# Patient Record
Sex: Female | Born: 1935 | Race: White | Hispanic: No | Smoking: Never smoker
Health system: Southern US, Community
[De-identification: ages and names within clinical notes are randomized; demographics above are authoritative.]

## PROBLEM LIST (undated history)

## (undated) DIAGNOSIS — I10 Essential (primary) hypertension: Secondary | ICD-10-CM

## (undated) DIAGNOSIS — R011 Cardiac murmur, unspecified: Secondary | ICD-10-CM

## (undated) DIAGNOSIS — M1990 Unspecified osteoarthritis, unspecified site: Secondary | ICD-10-CM

## (undated) DIAGNOSIS — D649 Anemia, unspecified: Secondary | ICD-10-CM

## (undated) DIAGNOSIS — I4891 Unspecified atrial fibrillation: Secondary | ICD-10-CM

## (undated) DIAGNOSIS — I714 Abdominal aortic aneurysm, without rupture, unspecified: Secondary | ICD-10-CM

## (undated) DIAGNOSIS — E785 Hyperlipidemia, unspecified: Secondary | ICD-10-CM

## (undated) DIAGNOSIS — C801 Malignant (primary) neoplasm, unspecified: Secondary | ICD-10-CM

## (undated) DIAGNOSIS — M2550 Pain in unspecified joint: Secondary | ICD-10-CM

## (undated) DIAGNOSIS — G4700 Insomnia, unspecified: Secondary | ICD-10-CM

## (undated) HISTORY — DX: Essential (primary) hypertension: I10

## (undated) HISTORY — DX: Unspecified atrial fibrillation: I48.91

## (undated) HISTORY — DX: Pain in unspecified joint: M25.50

## (undated) HISTORY — DX: Insomnia, unspecified: G47.00

## (undated) HISTORY — DX: Abdominal aortic aneurysm, without rupture: I71.4

## (undated) HISTORY — DX: Malignant (primary) neoplasm, unspecified: C80.1

## (undated) HISTORY — DX: Anemia, unspecified: D64.9

## (undated) HISTORY — DX: Abdominal aortic aneurysm, without rupture, unspecified: I71.40

## (undated) HISTORY — DX: Unspecified osteoarthritis, unspecified site: M19.90

## (undated) HISTORY — DX: Cardiac murmur, unspecified: R01.1

## (undated) HISTORY — PX: ABDOMINAL HYSTERECTOMY: SHX81

## (undated) HISTORY — DX: Hyperlipidemia, unspecified: E78.5

## (undated) HISTORY — PX: MASTECTOMY: SHX3

## (undated) HISTORY — PX: FRACTURE SURGERY: SHX138

---

## 2005-06-07 ENCOUNTER — Ambulatory Visit: Payer: Self-pay | Admitting: Oncology

## 2006-06-12 ENCOUNTER — Ambulatory Visit: Payer: Self-pay | Admitting: Oncology

## 2007-10-29 ENCOUNTER — Ambulatory Visit: Payer: Self-pay | Admitting: Vascular Surgery

## 2007-11-05 ENCOUNTER — Encounter: Admission: RE | Admit: 2007-11-05 | Discharge: 2007-11-05 | Payer: Self-pay | Admitting: Vascular Surgery

## 2008-05-19 ENCOUNTER — Ambulatory Visit: Payer: Self-pay | Admitting: Vascular Surgery

## 2008-11-17 ENCOUNTER — Ambulatory Visit: Payer: Self-pay | Admitting: Vascular Surgery

## 2009-05-18 ENCOUNTER — Ambulatory Visit: Payer: Self-pay | Admitting: Vascular Surgery

## 2009-12-07 ENCOUNTER — Ambulatory Visit: Payer: Self-pay | Admitting: Vascular Surgery

## 2010-05-31 ENCOUNTER — Ambulatory Visit: Payer: Self-pay | Admitting: Vascular Surgery

## 2010-12-07 ENCOUNTER — Ambulatory Visit: Payer: Self-pay | Admitting: Vascular Surgery

## 2011-01-04 ENCOUNTER — Encounter (INDEPENDENT_AMBULATORY_CARE_PROVIDER_SITE_OTHER): Payer: Medicare Other

## 2011-01-04 ENCOUNTER — Ambulatory Visit (INDEPENDENT_AMBULATORY_CARE_PROVIDER_SITE_OTHER): Payer: Medicare Other | Admitting: Vascular Surgery

## 2011-01-04 DIAGNOSIS — I714 Abdominal aortic aneurysm, without rupture, unspecified: Secondary | ICD-10-CM

## 2011-01-05 NOTE — Assessment & Plan Note (Signed)
OFFICE VISIT  Tara, [name] DOB:  13-Apr-1918                                       01/04/2011 EAVWU#:[id number]  The patient has a known small abdominal aortic aneurysm.  She presents today for evaluation of this.  She was last seen in August of 2009.  At that time her aortic diameter was 4.8 cm in diameter.  She continues to deny any abdominal or back pain.  CHRONIC MEDICAL PROBLEMS:  Include elevated cholesterol, hypertension. She does have a remote history of breast cancer.  All of these problems are currently controlled.  SOCIAL HISTORY:  She currently lives alone and performs all of her daily activities on her own.  She does have 2 children who live nearby that help out occasionally.  She does not smoke.  FAMILY HISTORY:  Not remarkable for abdominal aortic aneurysm or other peripheral vascular disease.  REVIEW OF SYSTEMS:  Full 12 point review of systems was performed with the patient today. CARDIAC:  She has an occasional irregular heartbeat.  She denies any chest pain. PULMONARY:  She has no shortness of breath with exertion or at rest. All other systems were negative.  ALLERGIES:  She has no known drug allergies.  MEDICATIONS:  She did not bring a list of her current medications today.  PHYSICAL EXAM:  Vital signs:  Blood pressure is 124/63 in the left arm, heart rate 76 and regular.  General:  She is alert and oriented x3, in no distress.  HEENT:  Unremarkable.  Neck:  Has 2+ carotid pulses without bruit.  Chest:  Clear to auscultation.  Cardiac:  Regular rate and rhythm with a 3/6 murmur.  Abdomen:  Soft, nontender, nondistended. No pulsatile masses easily found.  Extremities:  She has 2+ radial and 2+ femoral pulses bilaterally.  She has absent pedal pulses bilaterally. Musculoskeletal:  Shows no major obvious joint deformities except for her right wrist from a previous fall out of a tree as a teenager. Neurological:  Exam shows  symmetric upper extremity and lower extremity motor strength which is 5/5.  Skin:  Has no open ulcers or rashes.  I reviewed her aortic ultrasound today which shows a 4.89 cm abdominal aortic aneurysm.  This is essentially unchanged from 4.8 cm in August of 2011 and overall unchanged over the last 3 years.  Overall the patient is doing well.  Her aneurysm has really not show any growth over the last 3 years.  We will continue intermittent surveillance to make sure that if it grows over time we will be able to fix this in a timely fashion.  She will return for a repeat ultrasound in 6 months' time.  She will return sooner if she has any abdominal or back pain.    Tara [name]. Tara Lowrimore, MD Electronically Signed  CEF/MEDQ  D:  01/04/2011  T:  01/05/2011  Job:  [id number]  cc:   Tara [name], M.D.

## 2011-01-13 NOTE — Procedures (Unsigned)
DUPLEX ULTRASOUND OF ABDOMINAL AORTA  INDICATION:  Follow up known AAA.  HISTORY: Diabetes:  No Cardiac:  Murmur Hypertension:  Yes Smoking:  No Family History:  No Previous Surgery:  No  DUPLEX EXAM:         AP (cm)                   TRANSVERSE (cm) Proximal             2.57 cm                   2.54 cm Mid                  2.62 cm                   2.65 cm Distal               4.76 cm                   4.89 cm Right Iliac          not visualized            not visualized Left Iliac           not visualized            not visualized  PREVIOUS:  Date: 05/31/2010  AP:  4.8  TRANSVERSE:  4.8  IMPRESSION: 1. Aneurysmal dilatation of the distal abdominal aorta with no     significant change in maximum diameter when compared to the     previous exam. 2. Unable to visualize bilateral common iliac arteries due to     overlying bowel gas.  ___________________________________________ Janetta Hora. Darrick Penna, MD  EM/MEDQ  D:  01/05/2011  T:  01/05/2011  Job:  [id number]

## 2011-02-27 NOTE — Procedures (Signed)
DUPLEX ULTRASOUND OF ABDOMINAL AORTA   INDICATION:  Abdominal aortic aneurysm.   HISTORY:  Diabetes:  No.  Cardiac:  Murmur.  Hypertension:  No.  Smoking:  No.  Connective Tissue Disorder:  Family History:  No.  Previous Surgery:  No.   DUPLEX EXAM:         AP (cm)                   TRANSVERSE (cm)  Proximal             2.5 cm                    2.5 cm  Mid                  2.5 cm                    2.4 cm  Distal               4.5 cm                    4.7 cm  Right Iliac          1.1 cm                    1.2 cm  Left Iliac           1.5 cm                    1.5 cm   PREVIOUS:  Date:  AP:  TRANSVERSE:   IMPRESSION:  Aneurysm of the distal abdominal aorta noted.   ___________________________________________  Janetta Hora Fields, MD   CH/MEDQ  D:  05/19/2008  T:  05/19/2008  Job:  [id number]

## 2011-02-27 NOTE — Procedures (Signed)
DUPLEX ULTRASOUND OF ABDOMINAL AORTA   INDICATION:  Abdominal aortic aneurysm, patient states that she is  asymptomatic.   HISTORY:  Diabetes:  No.  Cardiac:  Murmur.  Hypertension:  Yes.  Smoking:  No.  Connective Tissue Disorder:  Family History:  No.  Previous Surgery:  No.   DUPLEX EXAM:         AP (cm)                   TRANSVERSE (cm)  Proximal             2.6 cm                    2.6 cm  Mid                  2.5 cm                    2.8 cm  Distal               4.8 cm                    4.8 cm  Right Iliac          Not visualized            Not visualized  Left Iliac           1.4 cm                    1.1 cm   PREVIOUS:  Date: 12/07/2009  AP:  4.63  TRANSVERSE:  4.73   IMPRESSION:  1. Aneurysmal dilatation of the distal abdominal aorta with no      significant change in maximum diameter when compared to the      previous examination.  2. Unable to adequately visualize the right common iliac artery due to      overlying bowel gas patterns.  3. Incidental finding:  A Doppler velocity of 298 cm/s is noted in the      celiac artery, which is suggestive of a >75% stenosis.   ___________________________________________  Janetta Hora Fields, MD   CH/MEDQ  D:  05/31/2010  T:  05/31/2010  Job:  [id number]

## 2011-02-27 NOTE — Assessment & Plan Note (Signed)
OFFICE VISIT   EARLE, BURSON  DOB:  1918-01-12                                       05/19/2008  CHART#:[id number]   The patient is an 77 year old female I have been following for a small  abdominal aortic aneurysm.  She was last seen in January of 2009.  Since  that time she has had no new problems.  She continues to deny any  abdominal or back pain.  She states that her blood pressure is well  controlled.   PHYSICAL EXAMINATION:  On physical exam today blood pressure is 124/68  in the left arm.  Pulse is 75 and regular.  HEENT:  Unremarkable.  Neck:  Has 2+ carotid pulses without bruit.  Chest:  Clear to auscultation.  Cardiac:  Is regular rate and rhythm.  Abdomen:  Is soft, nontender,  nondistended with vaguely palpable pulsatile mass.  Vascular:  She has  2+ femoral pulses bilaterally.  She has absent pedal pulses bilaterally.  Feet are pink, warm and adequately perfused.   MEDICATIONS:  1. Medication list was updated today.  She currently takes Lotrel 5/20      two a day.  2. Zocor 40 mg daily at bedtime.  3. Carvedilol 25 mg two a day.  4. Trazodone 100 mg daily at bedtime.  5. Fosamax 70 mg once a week.  6. Omega-3 fish oil 3 a day.  7. Calcium 3 a day.  8. Aspirin 325 mg once a day.  9. Tylenol Arthritis 3 times a day.   She had an aortic ultrasound performed today which shows that the  aneurysm is 4.7 cm in diameter.  Overall the patient is unchanged.  I  believe we should have continued observation with aortic ultrasound  every 6 months.  If this reaches 5 to 5.5 cm we would consider repair at  that time.  The patient will return for followup in 6 months' time.   Janetta Hora. Fields, MD  Electronically Signed   CEF/MEDQ  D:  05/20/2008  T:  05/20/2008  Job:  [id number]   cc:   Raynelle Jan, M.D.

## 2011-02-27 NOTE — Assessment & Plan Note (Signed)
OFFICE VISIT   DESHIA, VANDERHOOF  DOB:  [birth year]                                       10/29/2007  CHART#:[id number]   Patient is an 77 year old female referred for evaluation of abdominal  aortic aneurysm.  She recently had a CT scan for abdominal pain and some  mild rectal bleeding.  On the CT scan, she was noted to have a 4.8 cm  abdominal aortic aneurysm.  Her pain has since resolved.  This was last  week.  She states that the hematochezia has also resolved.  She is  fairly active and lives alone and does her own activities of daily  living.  Denies a family history of aneurysm.  Atherosclerotic risk  factors are primarily remarkable for elevated cholesterol.  She denies a  history of coronary artery disease, hypertension, diabetes, or smoking.   Past medical history is otherwise remarkable for breast cancer seven  years ago.  She had removal of this cancer from her left breast at that  time.   Past surgical history is also remarkable for appendectomy, rectocele and  cystocele repair.  She also had a fairly traumatic injury to her right  wrist, which left her with some deformity from a severe fracture as a  child with osteomyelitis.   MEDICATIONS:  1. Aspirin once daily.  2. Calcium once daily.  3. Fish oil once daily.  4. Garlic once daily.  5. Toprol once daily.  6. Simvastatin once daily.  7. Trazodone at night.  8. Alendronate once daily.  9. She is also currently on Cipro and Flagyl.  This was apparently      started for presumed diverticulitis.   PHYSICAL EXAMINATION:  Blood pressure is 152/76, heart rate 85 and  regular.  HEENT:  Unremarkable.  She has 2+ carotid pulses without  bruits.  Chest:  Clear to auscultation.  Cardiac:  Regular rate and  rhythm without murmur.  Abdomen is soft and nontender with an easily  palpable pulsatile mass in the epigastrium.  Lower extremities:  She has  2+ femoral pulses with absent popliteal and pedal  pulses bilaterally.  Upper extremities:  She has 2+ brachial and radial pulse on the left  side.  She has absent brachial and radial pulse on the right side.  Right wrist is deformed.  She has flexion contractures of several of her  digits in the right hand.   I reviewed her CT scan of the abdomen and pelvis, dated October 27, 2007.  She has a 4.8 cm infrarenal abdominal aortic aneurysm by report;  however, by my measurement, this approaches 5 cm.  CT scan was done in 5  mm sections.  The neck of the aneurysm is approximately 26 mm.  Common  femoral arteries are 9 mm in diameter.  There is no evidence of rupture.  There was some inflammation in the left pelvic wall suspicious for  diverticulitis.  There is no free air.  CT scan was otherwise fairly  unremarkable.   I do not believe that patient's aneurysm is currently symptomatic;  however, it is of a size where repair should be considered.  In light of  the patient's advanced and overall fairly frail state on exam, I do not  believe she would be a candidate for open repair at this time; however,  I have ordered  a CT angiogram to see if she might be a candidate for  stent graft repair.  I will re-evaluate her next week after her CT scan.  I discussed the findings today with her son and the patient as well.   Janetta Hora. Fields, MD  Electronically Signed   CEF/MEDQ  D:  10/30/2007  T:  10/30/2007  Job:  [id number]   cc:   Raynelle Jan, M.D.

## 2011-02-27 NOTE — Procedures (Signed)
DUPLEX ULTRASOUND OF ABDOMINAL AORTA   INDICATION:  Abdominal aortic aneurysm.   HISTORY:  Diabetes:  No.  Cardiac:  Murmur.  Hypertension:  Yes.  Smoking:  No.  Connective Tissue Disorder:  Family History:  No.  Previous Surgery:  No.   DUPLEX EXAM:         AP (cm)                   TRANSVERSE (cm)  Proximal             2.4 cm                    2.3 cm  Mid                  2.5 cm                    2.3 cm  Distal               4.6 cm                    4.7 cm  Right Iliac          1.3 cm                    1.3 cm  Left Iliac           1.3 cm                    1.4 cm   PREVIOUS:  Date: 11/17/08  AP:  4.5  TRANSVERSE:  4.7   IMPRESSION:  Aneurysm of the mid-to-distal abdominal aorta with no  significant change in maximum diameter when compared to the previous  examination.   ___________________________________________  Janetta Hora Fields, MD   CH/MEDQ  D:  05/18/2009  T:  05/18/2009  Job:  [id number]

## 2011-02-27 NOTE — Procedures (Signed)
DUPLEX ULTRASOUND OF ABDOMINAL AORTA   INDICATION:  Followup abdominal aortic aneurysm.   HISTORY:  Diabetes:  No.  Cardiac:  Murmur.  Hypertension:  Yes.  Smoking:  No.  Connective Tissue Disorder:  Family History:  No.  Previous Surgery:  No.   DUPLEX EXAM:         AP (cm)                   TRANSVERSE (cm)  Proximal             2.29 cm                   2.12 cm  Mid                  2.74 cm                   2.87 cm  Distal               4.54 cm                   4.79 cm  Right Iliac          1.26 cm                   cm  Left Iliac           1.28 cm                   cm   PREVIOUS:  Date:  05/19/2008  AP:  4.5  TRANSVERSE:  4.7   IMPRESSION:  1. Stable abdominal aortic aneurysm with largest measurement of 4.54      cm x 4.79 cm.  2. Common iliac arteries are technically difficult to visualize well      due to bowel gas.    ___________________________________________  Janetta Hora. Fields, MD   AS/MEDQ  D:  11/17/2008  T:  11/17/2008  Job:  [id number]

## 2011-02-27 NOTE — Procedures (Signed)
DUPLEX ULTRASOUND OF ABDOMINAL AORTA   INDICATION:  Follow up abdominal aortic aneurysm.   HISTORY:  Diabetes:  No.  Cardiac:  Murmur.  Hypertension:  Yes.  Smoking:  No.  Connective Tissue Disorder:  Family History:  No.  Previous Surgery:  No.   DUPLEX EXAM:         AP (cm)                   TRANSVERSE (cm)  Proximal             1.94 cm                   1.96 cm  Mid                  2.14 cm                   2.42 cm  Distal               4.63 cm                   4.73 cm  Right Iliac          1.08 cm                   1.19 cm  Left Iliac           1.01 cm                   1.22 cm   PREVIOUS:  Date:  05/18/09  AP:  4.6  TRANSVERSE:  4.7   IMPRESSION:  Stable abdominal aortic aneurysm with largest measurement  of 4.63 cm X 4.73 cm.   ___________________________________________  Janetta Hora. Fields, MD   AS/MEDQ  D:  12/07/2009  T:  12/07/2009  Job:  [id number]

## 2011-08-02 ENCOUNTER — Encounter: Payer: Self-pay | Admitting: Vascular Surgery

## 2011-08-02 ENCOUNTER — Ambulatory Visit: Payer: Medicare Other | Admitting: Vascular Surgery

## 2011-08-09 ENCOUNTER — Encounter: Payer: Self-pay | Admitting: Vascular Surgery

## 2011-08-09 ENCOUNTER — Ambulatory Visit (INDEPENDENT_AMBULATORY_CARE_PROVIDER_SITE_OTHER): Payer: Medicare Other | Admitting: Vascular Surgery

## 2011-08-09 VITALS — BP 162/75 | HR 67 | Temp 97.5°F | Ht 65.0 in | Wt 122.0 lb

## 2011-08-09 DIAGNOSIS — I714 Abdominal aortic aneurysm, without rupture: Secondary | ICD-10-CM

## 2011-08-09 NOTE — Progress Notes (Signed)
[facility] [place] HISTORY AND PHYSICAL   History of Present Illness:  Patient is a 77 y.o. year old female who presents for follow-up evaluation of AAA.  The patient denies new abdominal or back pain.  The patient's atherosclerotic risk factors remain elevated cholesterol and hypertension.  These are all currently stable and followed by their primary care physician.  The patients AAA was 4.7 cm on intial evaluation in 2009.  She had a CT angio at [facility] 07/27/2011 which I reviewed today.  He aneurysm is 4.9 cm in diameter. It is infrarenal. She does have a 3.5 cm ectatic thoracic aorta. The neck of her infrarenal aorta is fairly short and angulated. She also has some evidence of calcified atherosclerotic plaque in her right common femoral artery. There was no evidence of rupture.  Past Medical History  Diagnosis Date  . AAA (abdominal aortic aneurysm)   . Heart murmur   . Hyperlipidemia   . Cancer     breast  . Arthritis   . Joint pain   . Hypertension   . Osteoporosis   . Insomnia   . Allergic rhinitis     Past Surgical History  Procedure Date  . Mastectomy     Left breast  . Abdominal hysterectomy     Review of Systems:  Neurologic: denies symptoms of TIA, amaurosis, or stroke Cardiac:denies shortness of breath or chest pain Pulmonary: denies cough or wheeze  Social History History  Substance Use Topics  . Smoking status: Never Smoker   . Smokeless tobacco: Not on file  . Alcohol Use: No  Remains active and lives independently, does not drive  Allergies  No Known Allergies   Current Outpatient Prescriptions  Medication Sig Dispense Refill  . amLODipine (NORVASC) 5 MG tablet Take 5 mg by mouth daily.        Marland Kitchen aspirin EC 81 MG tablet Take 81 mg by mouth daily.        Marland Kitchen CALCIUM PO Take by mouth.        . fish oil-omega-3 fatty acids 1000 MG capsule Take 2 g by mouth daily.        Marland Kitchen GARLIC PO Take by mouth.        . METOPROLOL TARTRATE PO  Take 25 mg by mouth daily.       Marland Kitchen SIMVASTATIN PO Take by mouth.          Physical Examination  Filed Vitals:   08/09/11 1429  BP: 162/75  Pulse: 67  Temp: 97.5 F (36.4 C)  TempSrc: Oral  Height: 5\' 5"  (1.651 m)  Weight: 122 lb (55.339 kg)  SpO2: 97%    Body mass index is 20.30 kg/(m^2).  General:  Alert and oriented, no acute distress HEENT: Normal Neck: No bruit or JVD Pulmonary: Clear to auscultation bilaterally Cardiac: Regular Rate and Rhythm 3/6 systolic murmur Abdomen: Soft, non-tender, non-distended, normal bowel sounds, palpable pulsatile mass in epigastrium    ASSESSMENT: Asymptomatic 4.9 cm abdominal aortic aneurysm. If the aneurysm grows to 5-5-1/2 cm we would consider repair at that point. It is certainly nearing end-stage now. She may be a stent graft candidate however due to the short angulated neck she may require open repair if we come to that.   PLAN: She will followup in 6 months time and an aortic ultrasound will also be repeated in 6 months time. If the aneurysm is greater than 5 cm on her next exam we will have discussions about  open versus endovascular repair. Symptoms of ruptured aneurysm as well as all these options were discussed with the patient and her son today.  Fabienne Bruns, MD [facility] [place] Office: [phone] Pager: [phone]

## 2011-08-22 ENCOUNTER — Encounter: Payer: Self-pay | Admitting: Vascular Surgery

## 2011-09-28 ENCOUNTER — Encounter: Payer: Self-pay | Admitting: Vascular Surgery

## 2011-12-12 ENCOUNTER — Other Ambulatory Visit: Payer: Self-pay | Admitting: *Deleted

## 2011-12-12 DIAGNOSIS — I714 Abdominal aortic aneurysm, without rupture: Secondary | ICD-10-CM

## 2012-02-13 ENCOUNTER — Encounter: Payer: Self-pay | Admitting: Vascular Surgery

## 2012-02-14 ENCOUNTER — Ambulatory Visit (INDEPENDENT_AMBULATORY_CARE_PROVIDER_SITE_OTHER): Payer: Medicare Other | Admitting: Neurosurgery

## 2012-02-14 ENCOUNTER — Encounter: Payer: Self-pay | Admitting: Neurosurgery

## 2012-02-14 ENCOUNTER — Encounter (INDEPENDENT_AMBULATORY_CARE_PROVIDER_SITE_OTHER): Payer: Medicare Other | Admitting: *Deleted

## 2012-02-14 VITALS — BP 149/70 | HR 68 | Resp 16 | Ht 65.0 in | Wt 129.3 lb

## 2012-02-14 DIAGNOSIS — I714 Abdominal aortic aneurysm, without rupture, unspecified: Secondary | ICD-10-CM | POA: Insufficient documentation

## 2012-02-14 NOTE — Progress Notes (Signed)
[facility] [place] HISTORY AND PHYSICAL   CC: Six-month AAA duplex for known aneurysm Referring Physician: Fields  History of Present Illness: 78 year old female patient of Dr. Darrick Penna followed for serial duplex for known AAA. The patient seen with a female family member who reports no new medical problems with the patient, she does not have any complaints of back or abdominal pain.  Past Medical History  Diagnosis Date  . AAA (abdominal aortic aneurysm)   . Heart murmur   . Hyperlipidemia   . Cancer     breast  . Arthritis   . Joint pain   . Hypertension   . Osteoporosis   . Insomnia   . Allergic rhinitis     ROS: [x]  Positive   [ ]  Denies    General: [ ]  Weight loss, [ ]  Fever, [ ]  chills Neurologic: [ ]  Dizziness, [ ]  Blackouts, [ ]  Seizure [ ]  Stroke, [ ]  "Mini stroke", [ ]  Slurred speech, [ ]  Temporary blindness; [ ]  weakness in arms or legs, [ ]  Hoarseness, the patient does report some numbness in her lower extremities. Cardiac: [ ]  Chest pain/pressure, [ ]  Shortness of breath at rest [ ]  Shortness of breath with exertion, [x ] Atrial fibrillation or irregular heartbeat Vascular: [ ]  Pain in legs with walking, [ ]  Pain in legs at rest, [ ]  Pain in legs at night,  [ ]  Non-healing ulcer, [ ]  Blood clot in vein/DVT,   Pulmonary: [ ]  Home oxygen, [ ]  Productive cough, [ ]  Coughing up blood, [ ]  Asthma,  [ ]  Wheezing Musculoskeletal:  [ ]  Arthritis, [ ]  Low back pain, [ ]  Joint pain Hematologic: [ ]  Easy Bruising, [ ]  Anemia; [ ]  Hepatitis Gastrointestinal: [ ]  Blood in stool, [ ]  Gastroesophageal Reflux/heartburn, [ ]  Trouble swallowing Urinary: [ ]  chronic Kidney disease, [ ]  on HD - [ ]  MWF or [ ]  TTHS, [ ]  Burning with urination, [ ]  Difficulty urinating Skin: [ ]  Rashes, [ ]  Wounds Psychological: [ ]  Anxiety, [ ]  Depression   Social History History  Substance Use Topics  . Smoking status: Never Smoker   . Smokeless tobacco: Not on file  .  Alcohol Use: No    Family History History reviewed. No pertinent family history.  No Known Allergies  Current Outpatient Prescriptions  Medication Sig Dispense Refill  . amLODipine (NORVASC) 5 MG tablet Take 5 mg by mouth daily.        Marland Kitchen amLODipine-benazepril (LOTREL) 5-20 MG per capsule Take 5-20 mg by mouth Daily.      Marland Kitchen aspirin EC 81 MG tablet Take 81 mg by mouth daily.        Marland Kitchen CALCIUM PO Take by mouth.        . CRESTOR 40 MG tablet Take 40 mg by mouth Daily.      . fish oil-omega-3 fatty acids 1000 MG capsule Take 2 g by mouth daily.        Marland Kitchen GARLIC PO Take by mouth.        . METOPROLOL TARTRATE PO Take 25 mg by mouth daily.       Marland Kitchen SIMVASTATIN PO Take by mouth.        . traZODone (DESYREL) 100 MG tablet Take 100 mg by mouth Ad lib.        Physical Examination  Filed Vitals:   02/14/12 1017  BP: 149/70  Pulse: 68  Resp: 16    Body  mass index is 21.52 kg/(m^2).  General:  WDWN in NAD Gait: Normal HEENT: WNL Eyes: Pupils equal Pulmonary: normal non-labored breathing , without Rales, rhonchi,  wheezing Cardiac: RRR, without  Murmurs, rubs or gallops; No carotid bruits Abdomen: soft, NT, no masses Skin: no rashes, ulcers noted Vascular Exam/Pulses: Patient has 2+ radial pulses, 2+ femoral pulses, I cannot palpate an abdominal pulsatile mass or lower extremity pulses  Extremities without ischemic changes, no Gangrene , no cellulitis; no open wounds;  Musculoskeletal: no muscle wasting or atrophy  Neurologic: A&O X 3; Appropriate Affect ; SENSATION: normal; MOTOR FUNCTION:  moving all extremities equally. Speech is fluent/normal  Non-Invasive Vascular Imaging: AAA duplex today shows a measurement of 4.9 previous was 4.89.  ASSESSMENT/PLAN: Patient with a known history of AAA that stable per duplex. She will followup in 6 months for repeat AAA duplex, her questions were encouraged and answered she is in agreement with this plan.  Lauree Chandler ANP  Clinic M.D.:  Fields

## 2012-02-14 NOTE — Progress Notes (Signed)
Addended by: Sharee Pimple on: 02/14/2012 02:35 PM   Modules accepted: Orders

## 2012-02-22 NOTE — Procedures (Unsigned)
DUPLEX ULTRASOUND OF ABDOMINAL AORTA  INDICATION:  Follow up AAA.  HISTORY: Diabetes:  No. Cardiac:  No. Hypertension:  Yes. Smoking:  No. Connective Tissue Disorder: Family History:  No. Previous Surgery:  No.  DUPLEX EXAM:         AP (cm)                   TRANSVERSE (cm) Proximal             2.26 cm                   2.22 cm Mid                  4.9 cm                    4.9 cm Distal               NV                        NV Right Iliac          1.1 cm Left Iliac           0.92 cm  PREVIOUS:  Date: 01/04/2011  AP:  4.76  TRANSVERSE:  4.89  IMPRESSION: 1. Markedly tortuous aorta with maximum diameter of 4.9 cm on today's     exam.  There is intramural thrombus observed. 2. Iliac arteries are difficult to visualize due to bowel gas and     tortuosity of the aorta.  ___________________________________________ Janetta Hora. Fields, MD  LT/MEDQ  D:  02/14/2012  T:  02/14/2012  Job:  [id number]

## 2012-08-20 ENCOUNTER — Encounter: Payer: Self-pay | Admitting: Neurosurgery

## 2012-08-21 ENCOUNTER — Encounter: Payer: Self-pay | Admitting: Neurosurgery

## 2012-08-21 ENCOUNTER — Encounter (INDEPENDENT_AMBULATORY_CARE_PROVIDER_SITE_OTHER): Payer: Medicare Other | Admitting: *Deleted

## 2012-08-21 ENCOUNTER — Ambulatory Visit (INDEPENDENT_AMBULATORY_CARE_PROVIDER_SITE_OTHER): Payer: Medicare Other | Admitting: Neurosurgery

## 2012-08-21 VITALS — BP 140/68 | HR 61 | Resp 16 | Ht 65.0 in | Wt 126.0 lb

## 2012-08-21 DIAGNOSIS — I714 Abdominal aortic aneurysm, without rupture, unspecified: Secondary | ICD-10-CM

## 2012-08-21 NOTE — Progress Notes (Signed)
[facility] [place] [facility] Office Note  CC: AAA surveillance Referring Physician: Fields  History of Present Illness: 78 year old female patient of Dr. Darrick Penna followed for known AAA. The patient denies any unusual abdominal or back pain, new medical diagnoses or recent surgery.  Past Medical History  Diagnosis Date  . AAA (abdominal aortic aneurysm)   . Heart murmur   . Hyperlipidemia   . Cancer     breast  . Arthritis   . Joint pain   . Hypertension   . Osteoporosis   . Insomnia   . Allergic rhinitis     ROS: [x]  Positive   [ ]  Denies    General: [ ]  Weight loss, [ ]  Fever, [ ]  chills Neurologic: [ ]  Dizziness, [ ]  Blackouts, [ ]  Seizure [ ]  Stroke, [ ]  "Mini stroke", [ ]  Slurred speech, [ ]  Temporary blindness; [ ]  weakness in arms or legs, [ ]  Hoarseness Cardiac: [ ]  Chest pain/pressure, [ ]  Shortness of breath at rest [ ]  Shortness of breath with exertion, [ ]  Atrial fibrillation or irregular heartbeat Vascular: [ ]  Pain in legs with walking, [ ]  Pain in legs at rest, [ ]  Pain in legs at night,  [ ]  Non-healing ulcer, [ ]  Blood clot in vein/DVT,   Pulmonary: [ ]  Home oxygen, [ ]  Productive cough, [ ]  Coughing up blood, [ ]  Asthma,  [ ]  Wheezing Musculoskeletal:  [ ]  Arthritis, [ ]  Low back pain, [ ]  Joint pain Hematologic: [ ]  Easy Bruising, [ ]  Anemia; [ ]  Hepatitis Gastrointestinal: [ ]  Blood in stool, [ ]  Gastroesophageal Reflux/heartburn, [ ]  Trouble swallowing Urinary: [ ]  chronic Kidney disease, [ ]  on HD - [ ]  MWF or [ ]  TTHS, [ ]  Burning with urination, [ ]  Difficulty urinating Skin: [ ]  Rashes, [ ]  Wounds Psychological: [ ]  Anxiety, [ ]  Depression   Social History History  Substance Use Topics  . Smoking status: Never Smoker   . Smokeless tobacco: Not on file  . Alcohol Use: No    Family History Family History  Problem Relation Age of Onset  . Family history unknown: Yes    No Known Allergies  Current Outpatient  Prescriptions  Medication Sig Dispense Refill  . amLODipine (NORVASC) 5 MG tablet Take 5 mg by mouth daily.        Marland Kitchen amLODipine-benazepril (LOTREL) 5-20 MG per capsule Take 5-20 mg by mouth Daily.      Marland Kitchen aspirin EC 81 MG tablet Take 81 mg by mouth daily.        Marland Kitchen CALCIUM PO Take by mouth.        . CRESTOR 40 MG tablet Take 40 mg by mouth Daily.      . Ezetimibe (ZETIA PO) Take by mouth daily.      . fish oil-omega-3 fatty acids 1000 MG capsule Take 2 g by mouth daily.        Marland Kitchen GARLIC PO Take by mouth.        . METOPROLOL TARTRATE PO Take 25 mg by mouth daily.       Marland Kitchen SIMVASTATIN PO Take by mouth.        . traZODone (DESYREL) 100 MG tablet Take 100 mg by mouth Ad lib.        Physical Examination  Filed Vitals:   08/21/12 1028  BP: 140/68  Pulse: 61  Resp: 16    Body mass index is 20.97 kg/(m^2).  General:  WDWN in NAD Gait: Normal HEENT: WNL Eyes: Pupils equal Pulmonary: normal non-labored breathing , without Rales, rhonchi,  wheezing Cardiac: RRR, without  Murmurs, rubs or gallops; No carotid bruits Abdomen: soft, NT, no masses Skin: no rashes, ulcers noted Vascular Exam/Pulses: Palpable lower extremity pulses bilaterally, there is a small abdominal pulsatile mass palpated  Extremities without ischemic changes, no Gangrene , no cellulitis; no open wounds;  Musculoskeletal: no muscle wasting or atrophy  Neurologic: A&O X 3; Appropriate Affect ; SENSATION: normal; MOTOR FUNCTION:  moving all extremities equally. Speech is fluent/normal  Non-Invasive Vascular Imaging: AAA duplex shows 4.9 measurement which is unchanged from previous exam in May 2013, this was reviewed with Dr. Darrick Penna  ASSESSMENT/PLAN: The patient will followup in 6 months with repeat AAA duplex, her questions were encouraged and answered, she is in agreement with this plan. Next  Lauree Chandler ANP  Clinic M.D.: Fields

## 2012-08-21 NOTE — Addendum Note (Signed)
Addended by: Sharee Pimple on: 08/21/2012 04:12 PM   Modules accepted: Orders

## 2013-02-19 ENCOUNTER — Encounter (INDEPENDENT_AMBULATORY_CARE_PROVIDER_SITE_OTHER): Payer: Medicare Other | Admitting: *Deleted

## 2013-02-19 ENCOUNTER — Ambulatory Visit: Payer: Medicare Other | Admitting: Neurosurgery

## 2013-02-19 DIAGNOSIS — I714 Abdominal aortic aneurysm, without rupture: Secondary | ICD-10-CM

## 2014-04-04 ENCOUNTER — Emergency Department (HOSPITAL_COMMUNITY): Payer: Medicare Other

## 2014-04-04 ENCOUNTER — Inpatient Hospital Stay (HOSPITAL_COMMUNITY)
Admission: EM | Admit: 2014-04-04 | Discharge: 2014-04-08 | DRG: 689 | Disposition: A | Discharge status: Skilled Nursing Facility | Payer: Medicare Other | Attending: Internal Medicine | Admitting: Internal Medicine

## 2014-04-04 ENCOUNTER — Encounter (HOSPITAL_COMMUNITY): Payer: Self-pay | Admitting: Emergency Medicine

## 2014-04-04 DIAGNOSIS — Z79899 Other long term (current) drug therapy: Secondary | ICD-10-CM

## 2014-04-04 DIAGNOSIS — G4700 Insomnia, unspecified: Secondary | ICD-10-CM | POA: Diagnosis present

## 2014-04-04 DIAGNOSIS — J189 Pneumonia, unspecified organism: Secondary | ICD-10-CM

## 2014-04-04 DIAGNOSIS — G9340 Encephalopathy, unspecified: Secondary | ICD-10-CM | POA: Diagnosis present

## 2014-04-04 DIAGNOSIS — Z9010 Acquired absence of unspecified breast and nipple: Secondary | ICD-10-CM

## 2014-04-04 DIAGNOSIS — N390 Urinary tract infection, site not specified: Principal | ICD-10-CM | POA: Diagnosis present

## 2014-04-04 DIAGNOSIS — M810 Age-related osteoporosis without current pathological fracture: Secondary | ICD-10-CM | POA: Diagnosis present

## 2014-04-04 DIAGNOSIS — R319 Hematuria, unspecified: Secondary | ICD-10-CM

## 2014-04-04 DIAGNOSIS — E440 Moderate protein-calorie malnutrition: Secondary | ICD-10-CM | POA: Diagnosis present

## 2014-04-04 DIAGNOSIS — I714 Abdominal aortic aneurysm, without rupture, unspecified: Secondary | ICD-10-CM

## 2014-04-04 DIAGNOSIS — F03918 Unspecified dementia, unspecified severity, with other behavioral disturbance: Secondary | ICD-10-CM

## 2014-04-04 DIAGNOSIS — E876 Hypokalemia: Secondary | ICD-10-CM

## 2014-04-04 DIAGNOSIS — R9431 Abnormal electrocardiogram [ECG] [EKG]: Secondary | ICD-10-CM

## 2014-04-04 DIAGNOSIS — R7989 Other specified abnormal findings of blood chemistry: Secondary | ICD-10-CM | POA: Diagnosis present

## 2014-04-04 DIAGNOSIS — D696 Thrombocytopenia, unspecified: Secondary | ICD-10-CM | POA: Diagnosis present

## 2014-04-04 DIAGNOSIS — Z7982 Long term (current) use of aspirin: Secondary | ICD-10-CM

## 2014-04-04 DIAGNOSIS — Z853 Personal history of malignant neoplasm of breast: Secondary | ICD-10-CM

## 2014-04-04 DIAGNOSIS — R410 Disorientation, unspecified: Secondary | ICD-10-CM | POA: Insufficient documentation

## 2014-04-04 DIAGNOSIS — I10 Essential (primary) hypertension: Secondary | ICD-10-CM

## 2014-04-04 DIAGNOSIS — E785 Hyperlipidemia, unspecified: Secondary | ICD-10-CM

## 2014-04-04 DIAGNOSIS — R627 Adult failure to thrive: Secondary | ICD-10-CM | POA: Diagnosis present

## 2014-04-04 DIAGNOSIS — R6889 Other general symptoms and signs: Secondary | ICD-10-CM

## 2014-04-04 DIAGNOSIS — F0391 Unspecified dementia with behavioral disturbance: Secondary | ICD-10-CM | POA: Diagnosis present

## 2014-04-04 LAB — COMPREHENSIVE METABOLIC PANEL
ALBUMIN: 3.7 g/dL (ref 3.5–5.2)
ALT: 14 U/L (ref 0–35)
AST: 23 U/L (ref 0–37)
Alkaline Phosphatase: 46 U/L (ref 39–117)
BUN: 23 mg/dL (ref 6–23)
CALCIUM: 10.4 mg/dL (ref 8.4–10.5)
CO2: 25 mEq/L (ref 19–32)
CREATININE: 1.01 mg/dL (ref 0.50–1.10)
Chloride: 106 mEq/L (ref 96–112)
GFR calc Af Amer: 54 mL/min — ABNORMAL LOW (ref >90)
GFR, EST NON AFRICAN AMERICAN: 46 mL/min — AB (ref >90)
Glucose, Bld: 115 mg/dL — ABNORMAL HIGH (ref 70–99)
Potassium: 3.5 mEq/L — ABNORMAL LOW (ref 3.7–5.3)
SODIUM: 145 meq/L (ref 137–147)
TOTAL PROTEIN: 7.4 g/dL (ref 6.0–8.3)
Total Bilirubin: 0.9 mg/dL (ref 0.3–1.2)

## 2014-04-04 LAB — CBC
HCT: 44 % (ref 36.0–46.0)
Hemoglobin: 14.5 g/dL (ref 12.0–15.0)
MCH: 30.1 pg (ref 26.0–34.0)
MCHC: 33 g/dL (ref 30.0–36.0)
MCV: 91.5 fL (ref 78.0–100.0)
PLATELETS: 126 10*3/uL — AB (ref 150–400)
RBC: 4.81 MIL/uL (ref 3.87–5.11)
RDW: 14.8 % (ref 11.5–15.5)
WBC: 8.1 10*3/uL (ref 4.0–10.5)

## 2014-04-04 LAB — POC OCCULT BLOOD, ED: FECAL OCCULT BLD: NEGATIVE

## 2014-04-04 LAB — RAPID URINE DRUG SCREEN, HOSP PERFORMED
AMPHETAMINES: NOT DETECTED
BENZODIAZEPINES: NOT DETECTED
Barbiturates: NOT DETECTED
Cocaine: NOT DETECTED
OPIATES: NOT DETECTED
Tetrahydrocannabinol: NOT DETECTED

## 2014-04-04 LAB — URINALYSIS, ROUTINE W REFLEX MICROSCOPIC
Bilirubin Urine: NEGATIVE
GLUCOSE, UA: NEGATIVE mg/dL
Ketones, ur: NEGATIVE mg/dL
Leukocytes, UA: NEGATIVE
Nitrite: NEGATIVE
PH: 6 (ref 5.0–8.0)
Protein, ur: NEGATIVE mg/dL
SPECIFIC GRAVITY, URINE: 1.016 (ref 1.005–1.030)
UROBILINOGEN UA: 0.2 mg/dL (ref 0.0–1.0)

## 2014-04-04 LAB — CBG MONITORING, ED: Glucose-Capillary: 107 mg/dL — ABNORMAL HIGH (ref 70–99)

## 2014-04-04 LAB — URINE MICROSCOPIC-ADD ON

## 2014-04-04 MED ORDER — HYDRALAZINE HCL 20 MG/ML IJ SOLN
5.0000 mg | INTRAMUSCULAR | Status: DC | PRN
  Administered 2014-04-04 – 2014-04-05 (×2): 5 mg via INTRAVENOUS
  Filled 2014-04-04 (×2): qty 1

## 2014-04-04 MED ORDER — DEXTROSE 5 % IV SOLN
1.0000 g | Freq: Once | INTRAVENOUS | Status: AC
  Administered 2014-04-04: 1 g via INTRAVENOUS
  Filled 2014-04-04: qty 10

## 2014-04-04 MED ORDER — SODIUM CHLORIDE 0.9 % IV BOLUS (SEPSIS): 1000.0000 mL | Freq: Once | INTRAVENOUS | Status: DC

## 2014-04-04 MED ORDER — DEXTROSE 5 % IV SOLN: 1.0000 g | Freq: Once | INTRAVENOUS | Status: DC

## 2014-04-04 MED ORDER — AZITHROMYCIN 250 MG PO TABS
500.0000 mg | ORAL_TABLET | Freq: Once | ORAL | Status: AC
  Administered 2014-04-04: 500 mg via ORAL
  Filled 2014-04-04: qty 2

## 2014-04-04 MED ORDER — SODIUM CHLORIDE 0.9 % IV SOLN
1000.0000 mL | Freq: Once | INTRAVENOUS | Status: AC
  Administered 2014-04-04: 1000 mL via INTRAVENOUS

## 2014-04-04 NOTE — ED Notes (Signed)
CBG taken = 107

## 2014-04-04 NOTE — ED Provider Notes (Addendum)
CSN: [id number]     Arrival date & time 04/04/14  1659 History   First MD Initiated Contact with Patient 04/04/14 1725     Chief Complaint  Patient presents with  . Altered Mental Status    Level V caveat do to confusion (Consider location/radiation/quality/duration/timing/severity/associated sxs/prior Treatment) HPI 80 year old female who lives alone and presents today do to increasing weakness, decreased appetite, and increased confusion. This is a level V caveat due to confusion the history is obtained from her son.  She lives alone but he sees her on a regular basis. He states that when he took her to her hair appointment this week that she had to go to the bathroom 3 times. He states that he thinks this was secondary to having loose stool. He has noted that she has not been eating as much as usual and has been increasingly confused. She has not complained of anything and he has not been that she has had any pain, fever, or other new symptoms.  Past Medical History  Diagnosis Date  . AAA (abdominal aortic aneurysm)   . Heart murmur   . Hyperlipidemia   . Cancer     breast  . Arthritis   . Joint pain   . Hypertension   . Osteoporosis   . Insomnia   . Allergic rhinitis    Past Surgical History  Procedure Laterality Date  . Mastectomy      Left breast  . Abdominal hysterectomy    . Fracture surgery      arm   No family history on file. History  Substance Use Topics  . Smoking status: Never Smoker   . Smokeless tobacco: Not on file  . Alcohol Use: No   OB History   Grav Para Term Preterm Abortions TAB SAB Ect Mult Living                 Review of Systems  Unable to perform ROS     Allergies  Review of patient's allergies indicates no known allergies.  Home Medications   Prior to Admission medications   Medication Sig Start Date End Date Taking? Authorizing Provider  amLODipine (NORVASC) 5 MG tablet Take 2.5 mg by mouth daily.    Yes Historical Provider, MD   aspirin EC 81 MG tablet Take 81 mg by mouth daily.     Yes Historical Provider, MD  carvedilol (COREG) 25 MG tablet Take 25 mg by mouth 2 (two) times daily with a meal.   Yes Historical Provider, MD  fish oil-omega-3 fatty acids 1000 MG capsule Take 2 g by mouth daily.     Yes Historical Provider, MD  Garlic 409 MG CAPS Take 600 mg by mouth 2 (two) times daily.   Yes Historical Provider, MD  rosuvastatin (CRESTOR) 40 MG tablet Take 40 mg by mouth daily.   Yes Historical Provider, MD  traZODone (DESYREL) 100 MG tablet Take 100 mg by mouth at bedtime as needed for sleep.  12/08/11  Yes Historical Provider, MD   BP 196/84  Pulse 79  Temp(Src) 97.5 F (36.4 C) (Oral)  Resp 20  SpO2 94% Physical Exam  Nursing note and vitals reviewed. Constitutional: She appears well-developed and well-nourished.  HENT:  Head: Normocephalic and atraumatic.  Mucous membranes are dry  Eyes: Conjunctivae and EOM are normal. Pupils are equal, round, and reactive to light.  Neck: Normal range of motion. Neck supple.  Cardiovascular: Normal rate, regular rhythm, normal heart sounds and intact distal pulses.  Pulmonary/Chest: Effort normal and breath sounds normal.  Abdominal: Soft. Bowel sounds are normal.  Genitourinary:     Polypoid lesion  Musculoskeletal: Normal range of motion.  Neurological: She is alert. She has normal reflexes. She displays normal reflexes. No cranial nerve deficit. She exhibits normal muscle tone. Coordination normal.  Patient is alert and pleasant. She is oriented to person and is able to state that that is her son with [facility] does not give me his name. She thinks that she is in a [place] the [place]. She has no idea what the year is.  Skin: Skin is warm and dry.  Psychiatric: She has a normal mood and affect. Her behavior is normal. Judgment and thought content normal.    ED Course  Procedures (including critical care time) Labs Review Labs Reviewed  CBC - Abnormal;  Notable for the following:    Platelets 126 (*)    All other components within normal limits  COMPREHENSIVE METABOLIC PANEL - Abnormal; Notable for the following:    Potassium 3.5 (*)    Glucose, Bld 115 (*)    GFR calc non Af Amer 46 (*)    GFR calc Af Amer 54 (*)    All other components within normal limits  URINALYSIS, ROUTINE W REFLEX MICROSCOPIC - Abnormal; Notable for the following:    Hgb urine dipstick LARGE (*)    All other components within normal limits  URINE CULTURE  URINE MICROSCOPIC-ADD ON  URINE RAPID DRUG SCREEN (HOSP PERFORMED)  POC OCCULT BLOOD, ED  CBG MONITORING, ED    Imaging Review Ct Abdomen Pelvis Wo Contrast  04/04/2014   CLINICAL DATA:  Lethargy and increase confusion over 3 days. Altered mental status. Hematuria.  EXAM: CT ABDOMEN AND PELVIS WITHOUT CONTRAST  TECHNIQUE: Multidetector CT imaging of the abdomen and pelvis was performed following the standard protocol without IV contrast.  COMPARISON:  07/25/2011  FINDINGS: Lung bases demonstrated a minimal right pleural effusion. There is motion artifact in the lung bases but there are changes consistent with diffuse interstitial infiltration or edema with bronchial thickening suggesting chronic bronchitis or airways disease. Coronary artery calcifications.  The kidneys are symmetrical in size and shape. There is evidence of bilateral parenchymal atrophy. No pyelocaliectasis or ureterectasis. Central renal vascular calcifications are present. No renal, ureteral, or bladder stones. No bladder wall thickening.  Cholelithiasis with multiple small stones in the gallbladder. No inflammatory changes. There is a large infrarenal abdominal aortic aneurysm measuring 5.4 cm maximal AP diameter. Extensive calcification of the abdominal aorta and branch vessels. Aneurysm is stable in size since prior study. No retroperitoneal hematoma.  The unenhanced appearance of the liver, spleen, pancreas, adrenal glands, inferior vena cava,  and retroperitoneal lymph nodes is unremarkable. Stomach, small bowel, and colon are not abnormally distended. No free air or free fluid in the abdomen.  Pelvis: Uterus appears to be surgically absent. No pelvic mass or lymphadenopathy. Scattered diverticula in the sigmoid colon without evidence of diverticulitis. Appendix is not identified. No free or loculated pelvic fluid collections.  Lumbar scoliosis convex towards the right. Degenerative changes throughout the lumbar spine and hips. No destructive bone lesions appreciated.  IMPRESSION: No renal or ureteral stone or obstruction identified. Small right pleural effusion with interstitial infiltration or edema an chronic bronchitic changes/airways disease suggested in the lung bases. Large abdominal aortic aneurysm measures 5.4 cm AP diameter, similar to previous study. Cholelithiasis.   Electronically Signed   By: Lucienne Capers M.D.   On: 04/04/2014  21:26   Dg Chest 2 View  04/04/2014   CLINICAL DATA:  Altered mental status  EXAM: CHEST  2 VIEW  COMPARISON:  05/27/2006 chest radiograph  FINDINGS: Upper limits normal heart size again noted.  New bilateral interstitial opacities may represent mild interstitial edema versus infection.  There is no evidence of focal airspace disease, pulmonary edema, suspicious pulmonary nodule/mass, pleural effusion, or pneumothorax. No acute bony abnormalities are identified.  IMPRESSION: Bilateral interstitial opacities this may represent mild interstitial edema versus interstitial infection.  Upper limits normal heart size.   Electronically Signed   By: Hassan Rowan M.D.   On: 04/04/2014 18:26   Ct Head Wo Contrast  04/04/2014   CLINICAL DATA:  Lethargy, increased confusion, history breast cancer, hypertension  EXAM: CT HEAD WITHOUT CONTRAST  TECHNIQUE: Contiguous axial images were obtained from the base of the skull through the vertex without intravenous contrast.  COMPARISON:  09/18/2010  FINDINGS: Generalized atrophy.   Normal ventricular morphology.  No midline shift or mass effect.  Small vessel chronic ischemic changes of deep cerebral white matter.  No intracranial hemorrhage, mass lesion, or acute infarction.  Visualized paranasal sinuses and mastoid air cells clear.  Bones unremarkable.  Atherosclerotic calcifications at the carotid siphons bilaterally.  IMPRESSION: Atrophy with small vessel chronic ischemic changes of deep cerebral white matter.  No acute intracranial abnormalities.   Electronically Signed   By: Lavonia Dana M.D.   On: 04/04/2014 18:14     EKG Interpretation   Date/Time:  Sunday April 04 2014 17:19:34 EDT Ventricular Rate:  64 PR Interval:  168 QRS Duration: 99 QT Interval:  411 QTC Calculation: 424 R Axis:   33 Text Interpretation:  Sinus rhythm Anteroseptal infarct, old Repol abnrm  suggests ischemia, diffuse leads Confirmed by Bret Vanessen MD, Andee Poles [id number]) on  04/04/2014 10:00:10 PM      MDM   Final diagnoses:  None    80 year old female comes in today with increased confusion and weakness. Workup was significant for chest x-Toben Acuna with bilateral interstitial opacities that may represent community acquired pneumonia. She also had some blood in her urine but could represent infection. This cultured. Patient is given Rocephin and Zithromax areas head CT does not show any acute abnormality. She does not have any focal neurologic deficits. Patient will be admitted for further assessment and treatment.    Shaune Pollack, MD 04/04/14 2202  Shaune Pollack, MD 04/04/14 2204  Discussed with Dr. Olen Pel and she will admit.  Shaune Pollack, MD 04/04/14 2213

## 2014-04-04 NOTE — H&P (Signed)
[facility] History and Physical  Tara [name] GBT:[id number] DOB: [birth year] DOA: 04/04/2014  Referring physician: ED physician PCP: Ward Givens   Chief Complaint: confusion   HPI:  Pt is 80 yo female with dementia, HTN, HLD, presented to [facility] ED with main concern of progressively worsening confusion. Please note that pt is unable to provide history due to confusion and dementia and son at bedside provided some information. He says pt lives at home alone and is independent, carries out ADL with no assistance. He has noted several days duration of progressively worsening confusion, poor oral intake, weakness, immobility, urinary frequency. He is not aware of fevers, chills, abdominal concerns, he has not noticed any specific focal neurological symptoms such as facial asymmetry, focal weakness.   In ED, pt noted to be pleasantly confused, following some simple commands but unable to answer most of the questions and unable to provide nay history. BP noted to be 200/70. CXR with ? PNA. TRH asked to admit for further evaluation.   Assessment and Plan: Active Problems: Altered mental status - unclear etiology, possibly multifactorial and secondary to underlying dementia, ? PNA/UTI, HTN-ive urgency  - will admit to telemetry bed  - start with providing supportive care with gentle hydration with NS, empiric ABX for PNA and ? UTI - discussed with son at bedside if MRI brain needed to rule out stroke, will hold off for now as no focal symptoms noted on exam  ? PNA - place on empiric ABX Zithromax and Rocephin - check urine legionella and strep pneumo - sputum and blood cultures requested as well  Accelerated Hypertension  - continue home medical regimen - place on Hydralazine as needed  Hypokalemia - supplement and repeat BMP in AM Moderate malnutrition  - advance diet as pt able to tolerate  Abdominal Aneurysm  - conservative management  Thrombocytopenia - no signs of active  bleeding - repeat CBC in AM FTT - will need PT evaluation to determine appropriate discharge plan   Radiological Exams on Admission: Ct Abdomen Pelvis Wo Contrast   04/04/2014   No renal or ureteral stone or obstruction identified. Small right pleural effusion with interstitial infiltration or edema an chronic bronchitic changes/airways disease suggested in the lung bases. Large abdominal aortic aneurysm measures 5.4 cm AP diameter, similar to previous study. Cholelithiasis.    Dg Chest 2 View  04/04/2014  Bilateral interstitial opacities this may represent mild interstitial edema versus interstitial infection.  Upper limits normal heart size.   Ct Head Wo Contras  04/04/2014   Atrophy with small vessel chronic ischemic changes of deep cerebral white matter.  No acute intracranial abnormalities.     Code Status: Full Family Communication: Son at bedside  Disposition Plan: Admit for further evaluation    Review of Systems:  Unable to provide due to AMS     Past Medical History  Diagnosis Date  . AAA (abdominal aortic aneurysm)   . Heart murmur   . Hyperlipidemia   . Cancer     breast  . Arthritis   . Joint pain   . Hypertension   . Osteoporosis   . Insomnia   . Allergic rhinitis     Past Surgical History  Procedure Laterality Date  . Mastectomy      Left breast  . Abdominal hysterectomy    . Fracture surgery      arm    Social History:  reports that she has never smoked. She does not have any smokeless tobacco history  on file. She reports that she does not drink alcohol or use illicit drugs.  No Known Allergies  Unknown family medical history   Prior to Admission medications   Medication Sig Start Date End Date Taking? Authorizing Provider  amLODipine (NORVASC) 5 MG tablet Take 2.5 mg by mouth daily.    Yes Historical Provider, MD  aspirin EC 81 MG tablet Take 81 mg by mouth daily.     Yes Historical Provider, MD  carvedilol (COREG) 25 MG tablet Take 25 mg by mouth 2  (two) times daily with a meal.   Yes Historical Provider, MD  fish oil-omega-3 fatty acids 1000 MG capsule Take 2 g by mouth daily.     Yes Historical Provider, MD  Garlic 096 MG CAPS Take 600 mg by mouth 2 (two) times daily.   Yes Historical Provider, MD  rosuvastatin (CRESTOR) 40 MG tablet Take 40 mg by mouth daily.   Yes Historical Provider, MD  traZODone (DESYREL) 100 MG tablet Take 100 mg by mouth at bedtime as needed for sleep.  12/08/11  Yes Historical Provider, MD    Physical Exam: Filed Vitals:   04/04/14 1947 04/04/14 2045 04/04/14 2115 04/04/14 2158  BP: 200/65 196/75 196/84 200/100  Pulse: 75 71 79 73  Temp:      TempSrc:      Resp: 25 18 20 23   SpO2: 93% 94% 94% 95%    Physical Exam  Constitutional: Appears stable, NAD, confused  HENT: Normocephalic. External right and left ear normal. Dry MM Eyes: Conjunctivae and EOM are normal. PERRLA, no scleral icterus.  Neck: Normal ROM. Neck supple. No JVD. No tracheal deviation. No thyromegaly.  CVS: RRR, SEM 3/6, no gallops, no carotid bruit.  Pulmonary: Effort and breath sounds normal, bibasilar rales Abdominal: Soft. BS +,  no distension, tenderness, rebound or guarding.  Musculoskeletal: Normal range of motion. No edema and no tenderness.  Lymphadenopathy: No lymphadenopathy noted, cervical, inguinal. Neuro: Alert but confused, oriented to name only. Normal reflexes, muscle tone coordination. No cranial nerve deficit. Skin: Skin is warm and dry. No rash noted. Not diaphoretic. No erythema. No pallor.  Psychiatric: Normal mood.  Labs on Admission:  Basic Metabolic Panel:  Recent Labs Lab 04/04/14 1725  NA 145  K 3.5*  CL 106  CO2 25  GLUCOSE 115*  BUN 23  CREATININE 1.01  CALCIUM 10.4   Liver Function Tests:  Recent Labs Lab 04/04/14 1725  AST 23  ALT 14  ALKPHOS 46  BILITOT 0.9  PROT 7.4  ALBUMIN 3.7   CBC:  Recent Labs Lab 04/04/14 1725  WBC 8.1  HGB 14.5  HCT 44.0  MCV 91.5  PLT 126*    CBG:  Recent Labs Lab 04/04/14 2156  GLUCAP 107*   EKG: Normal sinus rhythm, no ST/T wave changes  Faye Ramsay, MD  [facility] Pager [phone]  If 7PM-7AM, please contact night-coverage [email or web address] Password [facility] 04/04/2014, 10:28 PM

## 2014-04-04 NOTE — ED Notes (Signed)
Pulled patient up in bed and repositioned 

## 2014-04-04 NOTE — ED Notes (Signed)
Pt to CT

## 2014-04-04 NOTE — ED Notes (Addendum)
Per sons: pt lives independently but they have noticed over the last 3 days that pt appears lethargic and increased confusion. Pt is oriented to self, but confused on location, year and date. Pt is normally AO x4. Pt denies any complaint besides "it's just way too hot out." Neuro intact, grips equal.

## 2014-04-04 NOTE — ED Notes (Signed)
Pt back from CT

## 2014-04-05 ENCOUNTER — Encounter (HOSPITAL_COMMUNITY): Payer: Self-pay | Admitting: *Deleted

## 2014-04-05 DIAGNOSIS — J189 Pneumonia, unspecified organism: Secondary | ICD-10-CM | POA: Diagnosis present

## 2014-04-05 DIAGNOSIS — F03918 Unspecified dementia, unspecified severity, with other behavioral disturbance: Secondary | ICD-10-CM | POA: Diagnosis present

## 2014-04-05 DIAGNOSIS — E785 Hyperlipidemia, unspecified: Secondary | ICD-10-CM | POA: Diagnosis present

## 2014-04-05 DIAGNOSIS — G9340 Encephalopathy, unspecified: Secondary | ICD-10-CM | POA: Diagnosis present

## 2014-04-05 DIAGNOSIS — E876 Hypokalemia: Secondary | ICD-10-CM | POA: Diagnosis present

## 2014-04-05 DIAGNOSIS — R7989 Other specified abnormal findings of blood chemistry: Secondary | ICD-10-CM | POA: Diagnosis present

## 2014-04-05 DIAGNOSIS — F0391 Unspecified dementia with behavioral disturbance: Secondary | ICD-10-CM | POA: Diagnosis present

## 2014-04-05 DIAGNOSIS — I10 Essential (primary) hypertension: Secondary | ICD-10-CM | POA: Diagnosis present

## 2014-04-05 LAB — FOLATE: Folate: >20 ng/mL

## 2014-04-05 LAB — BASIC METABOLIC PANEL
BUN: 14 mg/dL (ref 6–23)
CALCIUM: 9.3 mg/dL (ref 8.4–10.5)
CHLORIDE: 102 meq/L (ref 96–112)
CO2: 22 mEq/L (ref 19–32)
CREATININE: 0.7 mg/dL (ref 0.50–1.10)
GFR calc Af Amer: 84 mL/min — ABNORMAL LOW (ref >90)
GFR calc non Af Amer: 72 mL/min — ABNORMAL LOW (ref >90)
Glucose, Bld: 130 mg/dL — ABNORMAL HIGH (ref 70–99)
Potassium: 3 mEq/L — ABNORMAL LOW (ref 3.7–5.3)
Sodium: 140 mEq/L (ref 137–147)

## 2014-04-05 LAB — TSH: TSH: 2.62 u[IU]/mL (ref 0.350–4.500)

## 2014-04-05 LAB — VITAMIN B12: Vitamin B-12: >2000 pg/mL — ABNORMAL HIGH (ref 211–911)

## 2014-04-05 LAB — CBC
HCT: 46 % (ref 36.0–46.0)
Hemoglobin: 15 g/dL (ref 12.0–15.0)
MCH: 29.7 pg (ref 26.0–34.0)
MCHC: 32.6 g/dL (ref 30.0–36.0)
MCV: 91.1 fL (ref 78.0–100.0)
Platelets: 123 10*3/uL — ABNORMAL LOW (ref 150–400)
RBC: 5.05 MIL/uL (ref 3.87–5.11)
RDW: 14.8 % (ref 11.5–15.5)
WBC: 9.5 10*3/uL (ref 4.0–10.5)

## 2014-04-05 LAB — PRO B NATRIURETIC PEPTIDE: Pro B Natriuretic peptide (BNP): 2166 pg/mL — ABNORMAL HIGH (ref 0–450)

## 2014-04-05 LAB — MAGNESIUM: Magnesium: 2 mg/dL (ref 1.5–2.5)

## 2014-04-05 LAB — RPR

## 2014-04-05 MED ORDER — SODIUM CHLORIDE 0.9 % IJ SOLN
3.0000 mL | Freq: Two times a day (BID) | INTRAMUSCULAR | Status: DC
  Administered 2014-04-05 (×3): 3 mL via INTRAVENOUS

## 2014-04-05 MED ORDER — HYDRALAZINE HCL 20 MG/ML IJ SOLN: 10.0000 mg | INTRAMUSCULAR | Status: DC | PRN

## 2014-04-05 MED ORDER — AMLODIPINE BESYLATE 2.5 MG PO TABS
2.5000 mg | ORAL_TABLET | Freq: Every day | ORAL | Status: DC
  Administered 2014-04-05: 2.5 mg via ORAL
  Filled 2014-04-05: qty 1

## 2014-04-05 MED ORDER — ONDANSETRON HCL 4 MG PO TABS: 4.0000 mg | ORAL_TABLET | Freq: Four times a day (QID) | ORAL | Status: DC | PRN

## 2014-04-05 MED ORDER — ASPIRIN EC 81 MG PO TBEC
81.0000 mg | DELAYED_RELEASE_TABLET | Freq: Every day | ORAL | Status: DC
  Administered 2014-04-05 – 2014-04-08 (×4): 81 mg via ORAL
  Filled 2014-04-05 (×5): qty 1

## 2014-04-05 MED ORDER — ENSURE COMPLETE PO LIQD
237.0000 mL | Freq: Two times a day (BID) | ORAL | Status: DC
  Administered 2014-04-06 – 2014-04-07 (×4): 237 mL via ORAL

## 2014-04-05 MED ORDER — SODIUM CHLORIDE 0.9 % IV SOLN
INTRAVENOUS | Status: DC
  Administered 2014-04-05: 03:00:00 via INTRAVENOUS

## 2014-04-05 MED ORDER — TRAZODONE HCL 100 MG PO TABS
100.0000 mg | ORAL_TABLET | Freq: Every evening | ORAL | Status: DC | PRN
  Administered 2014-04-05 (×2): 100 mg via ORAL
  Filled 2014-04-05 (×2): qty 1

## 2014-04-05 MED ORDER — POTASSIUM CHLORIDE CRYS ER 20 MEQ PO TBCR
40.0000 meq | EXTENDED_RELEASE_TABLET | Freq: Once | ORAL | Status: AC
  Administered 2014-04-05: 40 meq via ORAL
  Filled 2014-04-05: qty 2

## 2014-04-05 MED ORDER — ATORVASTATIN CALCIUM 10 MG PO TABS
10.0000 mg | ORAL_TABLET | Freq: Every day | ORAL | Status: DC
  Administered 2014-04-05 – 2014-04-07 (×3): 10 mg via ORAL
  Filled 2014-04-05 (×5): qty 1

## 2014-04-05 MED ORDER — ALBUTEROL SULFATE (2.5 MG/3ML) 0.083% IN NEBU: 2.5000 mg | INHALATION_SOLUTION | RESPIRATORY_TRACT | Status: DC | PRN

## 2014-04-05 MED ORDER — ENOXAPARIN SODIUM 40 MG/0.4ML ~~LOC~~ SOLN
40.0000 mg | SUBCUTANEOUS | Status: DC
  Administered 2014-04-05 – 2014-04-08 (×4): 40 mg via SUBCUTANEOUS
  Filled 2014-04-05 (×6): qty 0.4

## 2014-04-05 MED ORDER — DEXTROSE 5 % IV SOLN
1.0000 g | INTRAVENOUS | Status: DC
  Administered 2014-04-05: 1 g via INTRAVENOUS
  Filled 2014-04-05 (×2): qty 10

## 2014-04-05 MED ORDER — DEXTROSE 5 % IV SOLN
500.0000 mg | INTRAVENOUS | Status: DC
  Administered 2014-04-05: 500 mg via INTRAVENOUS
  Filled 2014-04-05 (×2): qty 500

## 2014-04-05 MED ORDER — AMLODIPINE BESYLATE 5 MG PO TABS
5.0000 mg | ORAL_TABLET | Freq: Every day | ORAL | Status: DC
  Administered 2014-04-05 – 2014-04-06 (×2): 5 mg via ORAL
  Filled 2014-04-05 (×2): qty 1

## 2014-04-05 MED ORDER — ONDANSETRON HCL 4 MG/2ML IJ SOLN: 4.0000 mg | Freq: Four times a day (QID) | INTRAMUSCULAR | Status: DC | PRN

## 2014-04-05 MED ORDER — CARVEDILOL 25 MG PO TABS
25.0000 mg | ORAL_TABLET | Freq: Two times a day (BID) | ORAL | Status: DC
  Administered 2014-04-05 – 2014-04-08 (×7): 25 mg via ORAL
  Filled 2014-04-05 (×11): qty 1

## 2014-04-05 NOTE — Progress Notes (Addendum)
Patient ID: Tara [name]  female  WGY:[id number]    DOB: [birth year]    DOA: 04/04/2014  PCP: Ward Givens  Assessment/Plan: Principal Problem:   Acute encephalopathy, likely superimposed on underlying dementia (per daughter-in-law, was not formerly diagnosed but patient has been having memory issues and worsened in the last few months, she lives alone, sons check up on her frequently) - Still pleasantly confused, oriented to herself, CT head negative for any stroke but did show it repeated small vessel chronic ischemic changes of deep cerebral white matter. Will continue IV antibiotics for presumed pneumonia, obtain TSH, B12, folate, RPR for dementia workup. Check MRI of the brain to rule out any CVA or NPH.  - PT evaluation, may need higher level of care, although patient's daughter in law reported that the family would prefer her home  Active Problems:   AAA (abdominal aortic aneurysm) -CT abdomen showed 5.4 cm AAA, per daughter-in-law, family is aware of it and do not want any interventions at this time, given her age of 55     CAP (community acquired pneumonia) - Continue IV Zithromax, Rocephin    Hypokalemia - Replaced    Elevated brain natriuretic peptide (BNP) level, abnormal EKG with repolarization changes - DC IV fluids, chest x-ray showed bilateral interstitial opacities, may represent mild interstitial edema versus interstitial infection. - Patient is not having any shortness of breath or chest pain, O2 sats 96% on room air    Essential hypertension, benign -Increase Norvasc, continue Coreg     Other and unspecified hyperlipidemia -Continue Lipitor    DVT Prophylaxis: Lovenox   Code Status: full code   Family Communication: discussed with patient daughter-in-law at the bedside   Disposition:  Consultants:  None   Procedures:  None   Antibiotics:  IV Zithromax    IV Rocephin   Subjective: Pleasantly confused, oriented to herself, denies any specific  complaints   Objective: Weight change:   Intake/Output Summary (Last 24 hours) at 04/05/14 1128 Last data filed at 04/05/14 0900  Gross per 24 hour  Intake 674.17 ml  Output      0 ml  Net 674.17 ml   Blood pressure 159/87, pulse 74, temperature 98 F (36.7 C), temperature source Oral, resp. rate 20, height 5\' 2"  (1.575 m), weight 56.2 kg (123 lb 14.4 oz), SpO2 96.00%.  Physical Exam: General: Alert and awake, oriented x1 CVS: S1-S2 clear, no murmur rubs or gallops Chest: clear to auscultation bilaterally, no wheezing, rales or rhonchi Abdomen: soft nontender, nondistended, normal bowel sounds  Extremities: no cyanosis, clubbing or edema noted bilaterally  Lab Results: Basic Metabolic Panel:  Recent Labs Lab 04/04/14 1725 04/05/14 0325  NA 145 140  K 3.5* 3.0*  CL 106 102  CO2 25 22  GLUCOSE 115* 130*  BUN 23 14  CREATININE 1.01 0.70  CALCIUM 10.4 9.3  MG  --  2.0   Liver Function Tests:  Recent Labs Lab 04/04/14 1725  AST 23  ALT 14  ALKPHOS 46  BILITOT 0.9  PROT 7.4  ALBUMIN 3.7   No results found for this basename: LIPASE, AMYLASE,  in the last 168 hours No results found for this basename: AMMONIA,  in the last 168 hours CBC:  Recent Labs Lab 04/04/14 1725 04/05/14 0325  WBC 8.1 9.5  HGB 14.5 15.0  HCT 44.0 46.0  MCV 91.5 91.1  PLT 126* 123*   Cardiac Enzymes: No results found for this basename: CKTOTAL, CKMB, CKMBINDEX, TROPONINI,  in the  last 168 hours BNP: No components found with this basename: POCBNP,  CBG:  Recent Labs Lab 04/04/14 2156  GLUCAP 107*     Micro Results: No results found for this or any previous visit (from the past 240 hour(s)).  Studies/Results: Ct Abdomen Pelvis Wo Contrast  04/04/2014   CLINICAL DATA:  Lethargy and increase confusion over 3 days. Altered mental status. Hematuria.  EXAM: CT ABDOMEN AND PELVIS WITHOUT CONTRAST  TECHNIQUE: Multidetector CT imaging of the abdomen and pelvis was performed  following the standard protocol without IV contrast.  COMPARISON:  07/25/2011  FINDINGS: Lung bases demonstrated a minimal right pleural effusion. There is motion artifact in the lung bases but there are changes consistent with diffuse interstitial infiltration or edema with bronchial thickening suggesting chronic bronchitis or airways disease. Coronary artery calcifications.  The kidneys are symmetrical in size and shape. There is evidence of bilateral parenchymal atrophy. No pyelocaliectasis or ureterectasis. Central renal vascular calcifications are present. No renal, ureteral, or bladder stones. No bladder wall thickening.  Cholelithiasis with multiple small stones in the gallbladder. No inflammatory changes. There is a large infrarenal abdominal aortic aneurysm measuring 5.4 cm maximal AP diameter. Extensive calcification of the abdominal aorta and branch vessels. Aneurysm is stable in size since prior study. No retroperitoneal hematoma.  The unenhanced appearance of the liver, spleen, pancreas, adrenal glands, inferior vena cava, and retroperitoneal lymph nodes is unremarkable. Stomach, small bowel, and colon are not abnormally distended. No free air or free fluid in the abdomen.  Pelvis: Uterus appears to be surgically absent. No pelvic mass or lymphadenopathy. Scattered diverticula in the sigmoid colon without evidence of diverticulitis. Appendix is not identified. No free or loculated pelvic fluid collections.  Lumbar scoliosis convex towards the right. Degenerative changes throughout the lumbar spine and hips. No destructive bone lesions appreciated.  IMPRESSION: No renal or ureteral stone or obstruction identified. Small right pleural effusion with interstitial infiltration or edema an chronic bronchitic changes/airways disease suggested in the lung bases. Large abdominal aortic aneurysm measures 5.4 cm AP diameter, similar to previous study. Cholelithiasis.   Electronically Signed   By: Lucienne Capers  M.D.   On: 04/04/2014 21:26   Dg Chest 2 View  04/04/2014   CLINICAL DATA:  Altered mental status  EXAM: CHEST  2 VIEW  COMPARISON:  05/27/2006 chest radiograph  FINDINGS: Upper limits normal heart size again noted.  New bilateral interstitial opacities may represent mild interstitial edema versus infection.  There is no evidence of focal airspace disease, pulmonary edema, suspicious pulmonary nodule/mass, pleural effusion, or pneumothorax. No acute bony abnormalities are identified.  IMPRESSION: Bilateral interstitial opacities this may represent mild interstitial edema versus interstitial infection.  Upper limits normal heart size.   Electronically Signed   By: Hassan Rowan M.D.   On: 04/04/2014 18:26   Ct Head Wo Contrast  04/04/2014   CLINICAL DATA:  Lethargy, increased confusion, history breast cancer, hypertension  EXAM: CT HEAD WITHOUT CONTRAST  TECHNIQUE: Contiguous axial images were obtained from the base of the skull through the vertex without intravenous contrast.  COMPARISON:  09/18/2010  FINDINGS: Generalized atrophy.  Normal ventricular morphology.  No midline shift or mass effect.  Small vessel chronic ischemic changes of deep cerebral white matter.  No intracranial hemorrhage, mass lesion, or acute infarction.  Visualized paranasal sinuses and mastoid air cells clear.  Bones unremarkable.  Atherosclerotic calcifications at the carotid siphons bilaterally.  IMPRESSION: Atrophy with small vessel chronic ischemic changes of deep cerebral white  matter.  No acute intracranial abnormalities.   Electronically Signed   By: Lavonia Dana M.D.   On: 04/04/2014 18:14    Medications: Scheduled Meds: . amLODipine  2.5 mg Oral Daily  . aspirin EC  81 mg Oral Daily  . atorvastatin  10 mg Oral q1800  . azithromycin  500 mg Intravenous Q24H  . carvedilol  25 mg Oral BID WC  . cefTRIAXone (ROCEPHIN)  IV  1 g Intravenous Q24H  . enoxaparin (LOVENOX) injection  40 mg Subcutaneous Q24H  . feeding supplement  (ENSURE COMPLETE)  237 mL Oral BID BM  . sodium chloride  3 mL Intravenous Q12H      LOS: 1 day   RAI,RIPUDEEP M.D. [facility] 04/05/2014, 11:28 AM Pager: [phone]  If 7PM-7AM, please contact night-coverage [email or web address] Password [email or web address]  **Disclaimer: This note was dictated with voice recognition software. Similar sounding words can inadvertently be transcribed and this note may contain transcription errors which may not have been corrected upon publication of note.**

## 2014-04-05 NOTE — Progress Notes (Signed)
INITIAL NUTRITION ASSESSMENT  DOCUMENTATION CODES Per approved criteria  -Not Applicable   INTERVENTION: Add Ensure Complete po BID, each supplement provides 350 kcal and 13 grams of protein. RD to continue to follow nutrition care plan.  NUTRITION DIAGNOSIS: Inadequate oral intake related to dementia as evidenced by chart review.   Goal: Intake to meet >90% of estimated nutrition needs.  Monitor:  weight trends, lab trends, I/O's, PO intake, supplement tolerance  Reason for Assessment: Malnutrition Screening Tool  80 y.o. female  Admitting Dx: AMS  ASSESSMENT: PMHx significant for dementia, HTN, HLD. Admitted with progressively worsening confusion. Work-up reveals possible PNA/UTI.  Per H&P, pt with several days duration of poor po intake. Sons feel that she has lost at least 6 lb in the last several weeks 2/2 poor appetite. RD is currently unable to confirm this with EPIC weight records and there is no family at bedside.  Currently ordered for a Regular diet. Consumed 25% of her breakfast this morning. Discussed intake with nurse tech at bedside. She reports that pt reports that she is not hungry and all she ate this morning was bacon.  Pt is at nutrition risk 2/2 poor intake.  RD completed brief physical exam. Pt without significant fat or muscle mass wasting on exam.  Potassium is low at 3.0 --> currently ordered for KCl  Height: Ht Readings from Last 1 Encounters:  04/05/14 5\' 2"  (1.575 m)    Weight: Wt Readings from Last 1 Encounters:  04/05/14 123 lb 14.4 oz (56.2 kg)    Ideal Body Weight: 110 lb  % Ideal Body Weight: 112%  Wt Readings from Last 10 Encounters:  04/05/14 123 lb 14.4 oz (56.2 kg)  08/21/12 126 lb (57.153 kg)  02/14/12 129 lb 4.8 oz (58.65 kg)  08/09/11 122 lb (55.339 kg)    Usual Body Weight: n/a  % Usual Body Weight: n/a  BMI:  Body mass index is 22.66 kg/(m^2). WNL  Estimated Nutritional Needs: Kcal: 1300 - 1450 Protein: 60 -  70 g  Fluid: approx 1.5 liters  Skin: round raised area on right buttock with blackened area in the middle  Diet Order: General  EDUCATION NEEDS: -Education not appropriate at this time   Intake/Output Summary (Last 24 hours) at 04/05/14 1058 Last data filed at 04/05/14 0900  Gross per 24 hour  Intake 674.17 ml  Output      0 ml  Net 674.17 ml    Last BM: 6/22  Labs:   Recent Labs Lab 04/04/14 1725 04/05/14 0325  NA 145 140  K 3.5* 3.0*  CL 106 102  CO2 25 22  BUN 23 14  CREATININE 1.01 0.70  CALCIUM 10.4 9.3  MG  --  2.0  GLUCOSE 115* 130*    CBG (last 3)   Recent Labs  04/04/14 2156  GLUCAP 107*    Scheduled Meds: . amLODipine  2.5 mg Oral Daily  . aspirin EC  81 mg Oral Daily  . atorvastatin  10 mg Oral q1800  . azithromycin  500 mg Intravenous Q24H  . carvedilol  25 mg Oral BID WC  . cefTRIAXone (ROCEPHIN)  IV  1 g Intravenous Q24H  . enoxaparin (LOVENOX) injection  40 mg Subcutaneous Q24H  . sodium chloride  3 mL Intravenous Q12H    Continuous Infusions:   Past Medical History  Diagnosis Date  . AAA (abdominal aortic aneurysm)   . Heart murmur   . Hyperlipidemia   . Cancer  breast  . Arthritis   . Joint pain   . Hypertension   . Osteoporosis   . Insomnia   . Allergic rhinitis     Past Surgical History  Procedure Laterality Date  . Mastectomy      Left breast  . Abdominal hysterectomy    . Fracture surgery      arm    Inda Coke MS, RD, LDN Inpatient Registered Dietitian Pager: [phone] After-hours pager: [phone]

## 2014-04-05 NOTE — Progress Notes (Signed)
New Admission Note:   Arrival Method: Via stretcher from the ED Mental Orientation:  Alert to self only.  Stated birthday was [birth year].  Unsure of reason for stay, place, nor date Telemetry: Placed on telemetry box [phone].  Rhythm is NSR Assessment: Completed Skin:  Dry and flaky.  She has round raised area on right buttock with blackened area in the middle.  Several moles on chest and on back.  Raised red area on left hip.  Son states she has been spending a lot of time on the couch. IV:  Has right forearm #20 gauge Pain: Denies Tubes:  None Safety Measures: Safety Fall Prevention Plan has been given, discussed and signed Admission: Completed [address] [place] Orientation: Patient has been orientated to the room, unit and staff.  Family:  Sons Tim and Baldwin at bedside.  She is from home alone.  Both sons check on her frequently.  Copied HCPOA and Living Will and placed in chart.  Prior to admission, patient was A&O x 4.  She only used a straight cane to ambulate.  Patient is very impulse and not easily redirected.  Family is in agreement to reason for bed alarm and considering patient as a high fall risk.  Sons feel that she has lost at least 6 pounds in the last several weeks.  They have noticed she has not been eating well lately due to poor appetite.  Sons state that doctor has seen lesion on mom's buttocks and moles on chest and back and doctor stated told them that no further action was required.  Oriented to unit.  Gave both sons my direct number to call during the night.    Orders have been reviewed and implemented. Will continue to monitor the patient. Call light has been placed within reach and bed alarm has been activated.   Earleen Reaper RN- [place], [place] Phone number: [phone]

## 2014-04-05 NOTE — Evaluation (Signed)
Physical Therapy Evaluation Patient Details Name: Tara [name] MRN: [id number] DOB: 1918-09-20 Today's Date: 04/05/2014   History of Present Illness  Pt is 80 yo female with dementia, HTN, HLD, presented to [facility] ED with main concern of progressively worsening confusion. Please note that pt is unable to provide history due to confusion and dementia and son at bedside provided some information. He says pt lives at home alone and is independent, carries out ADL with no assistance. He has noted several days duration of progressively worsening confusion, poor oral intake, weakness, immobility, urinary frequency. He is not aware of fevers, chills, abdominal concerns, he has not noticed any specific focal neurological symptoms such as facial asymmetry, focal weakness. In ED, pt noted to be pleasantly confused, following some simple commands but unable to answer most of the questions and unable to provide nay history. BP noted to be 200/70. CXR with ? PNA.  Clinical Impression   Pt admitted with above. Pt currently with functional limitations due to the deficits listed below (see PT Problem List).  Pt will benefit from skilled PT to increase their independence and safety with mobility to allow discharge to the venue listed below.   At pt's current functional level, she will need 24 hour supervision and assist, so it is worth pursueing SNF; Still, noted family would rather pt dc home (and home with familiar people and routine are often the most therapeutic place) -- 24 hour reliable assist will have to be arranged if pt is to dc home      Follow Up Recommendations SNF;Supervision/Assistance - 24 hour    Equipment Recommendations  Rolling walker with 5" wheels;3in1 (PT)    Recommendations for Other Services       Precautions / Restrictions Precautions Precautions: Fall      Mobility  Bed Mobility Overal bed mobility: Needs Assistance Bed Mobility: Sit to Supine       Sit to supine: Min  assist   General bed mobility comments: min assist for getting LEs into bed  Transfers Overall transfer level: Needs assistance Equipment used: 1 person hand held assist Transfers: Sit to/from Stand Sit to Stand: Mod assist         General transfer comment: Pt is able to power-up, but has a significant posterior lean, requiring mod assist for successful sit to stand  Ambulation/Gait Ambulation/Gait assistance: Mod assist;Min assist Ambulation Distance (Feet): 200 Feet (greater than) Assistive device: Rolling walker (2 wheeled);1 person hand held assist Gait Pattern/deviations: Narrow base of support;Scissoring;Trunk flexed     General Gait Details: first used RW with cues for posture and proximity to RW; transitioned to handheld assist, and noted pt's steps and gait speed improved, but her step width became quite erratic  Science writer    Modified Rankin (Stroke Patients Only)       Balance Overall balance assessment: Needs assistance           Standing balance-Leahy Scale: Poor                               Pertinent Vitals/Pain no apparent distress     Home Living Family/patient expects to be discharged to:: Private residence Living Arrangements: Alone Available Help at Discharge: Family;Other (Comment) (Sons check on her daily) Type of Home: House Home Access:  (tbd)     Home Layout:  (tbd) Home Equipment: Cane -  single point (per RN note; pt unable to answer questions reliably) Additional Comments: Pt is a poor historian; will need more reliable info re: home setup, assist    Prior Function Level of Independence: Needs assistance   Gait / Transfers Assistance Needed: Per RN note, pt walks with a cane  ADL's / Homemaking Assistance Needed: At one point in conversation pt stated her sons are good cooks  Comments: will need more relaible info re: pt's PLOF     Hand Dominance        Extremity/Trunk  Assessment   Upper Extremity Assessment: Generalized weakness           Lower Extremity Assessment: Overall WFL for tasks assessed         Communication   Communication: Other (comment) (oriented to self only)  Cognition Arousal/Alertness: Awake/alert Behavior During Therapy: Impulsive;WFL for tasks assessed/performed Overall Cognitive Status: No family/caregiver present to determine baseline cognitive functioning                      General Comments      Exercises        Assessment/Plan    PT Assessment Patient needs continued PT services  PT Diagnosis Difficulty walking   PT Problem List Decreased strength;Decreased balance;Decreased mobility;Decreased coordination;Decreased cognition;Decreased knowledge of use of DME;Decreased safety awareness  PT Treatment Interventions DME instruction;Gait training;Stair training;Functional mobility training;Therapeutic activities;Therapeutic exercise;Balance training;Patient/family education   PT Goals (Current goals can be found in the Care Plan section) Acute Rehab PT Goals Patient Stated Goal: unable to state PT Goal Formulation: Patient unable to participate in goal setting Time For Goal Achievement: 04/12/14 Potential to Achieve Goals: Good    Frequency Min 3X/week   Barriers to discharge Decreased caregiver support Need to know exactly how much caregiver support is available to pt at home    Co-evaluation               End of Session Equipment Utilized During Treatment: Gait belt Activity Tolerance: Patient tolerated treatment well Patient left: in bed;with call bell/phone within reach;with bed alarm set Nurse Communication: Mobility status         Time: 1130-1200 PT Time Calculation (min): 30 min   Charges:   PT Evaluation $Initial PT Evaluation Tier I: 1 Procedure PT Treatments $Gait Training: 23-37 mins   PT G Codes:          Quin Hoop 04/05/2014, 1:42 PM  Roney [place], [place] Pager [phone] Office [phone]

## 2014-04-06 ENCOUNTER — Inpatient Hospital Stay (HOSPITAL_COMMUNITY): Payer: Medicare Other

## 2014-04-06 DIAGNOSIS — F0391 Unspecified dementia with behavioral disturbance: Secondary | ICD-10-CM

## 2014-04-06 DIAGNOSIS — J189 Pneumonia, unspecified organism: Secondary | ICD-10-CM

## 2014-04-06 DIAGNOSIS — F03918 Unspecified dementia, unspecified severity, with other behavioral disturbance: Secondary | ICD-10-CM

## 2014-04-06 DIAGNOSIS — F29 Unspecified psychosis not due to a substance or known physiological condition: Secondary | ICD-10-CM

## 2014-04-06 DIAGNOSIS — R9431 Abnormal electrocardiogram [ECG] [EKG]: Secondary | ICD-10-CM

## 2014-04-06 LAB — BASIC METABOLIC PANEL
BUN: 17 mg/dL (ref 6–23)
CO2: 22 mEq/L (ref 19–32)
Calcium: 9.1 mg/dL (ref 8.4–10.5)
Chloride: 106 mEq/L (ref 96–112)
Creatinine, Ser: 0.85 mg/dL (ref 0.50–1.10)
GFR calc Af Amer: 66 mL/min — ABNORMAL LOW (ref >90)
GFR calc non Af Amer: 57 mL/min — ABNORMAL LOW (ref >90)
GLUCOSE: 83 mg/dL (ref 70–99)
POTASSIUM: 4.2 meq/L (ref 3.7–5.3)
Sodium: 143 mEq/L (ref 137–147)

## 2014-04-06 LAB — URINE CULTURE: Colony Count: 75000

## 2014-04-06 LAB — CBC
HCT: 42.7 % (ref 36.0–46.0)
HEMOGLOBIN: 14.1 g/dL (ref 12.0–15.0)
MCH: 30.5 pg (ref 26.0–34.0)
MCHC: 33 g/dL (ref 30.0–36.0)
MCV: 92.4 fL (ref 78.0–100.0)
Platelets: 126 10*3/uL — ABNORMAL LOW (ref 150–400)
RBC: 4.62 MIL/uL (ref 3.87–5.11)
RDW: 15 % (ref 11.5–15.5)
WBC: 7.8 10*3/uL (ref 4.0–10.5)

## 2014-04-06 MED ORDER — AMLODIPINE BESYLATE 10 MG PO TABS
10.0000 mg | ORAL_TABLET | Freq: Every day | ORAL | Status: DC
  Administered 2014-04-07 – 2014-04-08 (×2): 10 mg via ORAL
  Filled 2014-04-06 (×3): qty 1

## 2014-04-06 MED ORDER — LEVOFLOXACIN 750 MG PO TABS
750.0000 mg | ORAL_TABLET | ORAL | Status: DC
  Administered 2014-04-06 – 2014-04-08 (×2): 750 mg via ORAL
  Filled 2014-04-06 (×2): qty 1

## 2014-04-06 MED ORDER — AMLODIPINE BESYLATE 5 MG PO TABS
5.0000 mg | ORAL_TABLET | Freq: Once | ORAL | Status: AC
  Administered 2014-04-06: 5 mg via ORAL
  Filled 2014-04-06: qty 1

## 2014-04-06 NOTE — Evaluation (Signed)
I have read and agree with this note.   Time in/out: 10:26-10:55 Total time: 29 minutes (1EV, [place])  [place], OTR/L [phone]

## 2014-04-06 NOTE — Progress Notes (Signed)
Pt back from MRI via bed

## 2014-04-06 NOTE — Evaluation (Signed)
Occupational Therapy Evaluation Patient Details Name: Tara [name] MRN: [id number] DOB: 08-09-1918 Today's Date: 04/06/2014    History of Present Illness Pt is 80 yo female with dementia, HTN, HLD, presented to [facility] ED with main concern of progressively worsening confusion. Please note that pt is unable to provide history due to confusion and dementia and son at bedside provided some information. He says pt lives at home alone and is independent, carries out ADL with no assistance. He has noted several days duration of progressively worsening confusion, poor oral intake, weakness, immobility, urinary frequency. He is not aware of fevers, chills, abdominal concerns, he has not noticed any specific focal neurological symptoms such as facial asymmetry, focal weakness. In ED, pt noted to be pleasantly confused, following some simple commands but unable to answer most of the questions and unable to provide any history. BP noted to be 200/70. CXR with ? PNA.   Clinical Impression   PTA pt was independent per sons statement and now presents with above limiting her independence with self care activities. Pt is a poor historian and was unable to give adequate PLOF history or home information, family was called but received no answer. Pt will need 24/7 supervision upon d/c to SNF or home, the family would prefer to have pt go home instead of SNF due to her age. Pt would benefit from acute OT to increase safety awareness and maximize independence prior to d/c. Will continue to follow during acute stay.     Follow Up Recommendations  SNF;Supervision/Assistance - 24 hour;Other (comment) (Previous note states that the family would prefer to have her be d/c to home but was unable to contact them about d/c plans)          Precautions / Restrictions Precautions Precautions: Fall Restrictions Weight Bearing Restrictions: No      Mobility Bed Mobility               General bed mobility comments: Pt was  up in w/c at the nurses station upon OT arrival.  Transfers Overall transfer level: Needs assistance Equipment used: 1 person hand held assist Transfers: Sit to/from Stand Sit to Stand: Min assist         General transfer comment: Pt is able to power-up from w/c with min guard assist but required one person hand held min A for power up from toilet for safety.    Balance Overall balance assessment: Needs assistance Sitting-balance support: Feet supported Sitting balance-Leahy Scale: Fair Sitting balance - Comments: pt sitting in w/c during OT tx.   Standing balance support: Single extremity supported Standing balance-Leahy Scale: Fair Standing balance comment: slight postural sway while standing during activities, but pt required no physical assist                            ADL Overall ADL's : Needs assistance/impaired Eating/Feeding: Set up;Sitting   Grooming: Wash/dry hands;Wash/dry face;Oral care;Standing;Min guard   Upper Body Bathing: Set up;Sitting   Lower Body Bathing: Min guard;Sit to/from stand   Upper Body Dressing : Set up;Sitting   Lower Body Dressing: Min guard;Sit to/from stand   Toilet Transfer: Regular Toilet;Grab bars;Ambulation;Minimal assistance   Toileting- Clothing Manipulation and Hygiene: Sit to/from stand;Minimal assistance Toileting - Clothing Manipulation Details (indicate cue type and reason): hand held A for power up from sitting on low toilet only     Functional mobility during ADLs: Minimal assistance General ADL Comments: Pt able to stand  at the sink to do 3 grooming tasks with min guard A for safety. Pt did all aspects of toileting with min guard but did not put the toilet paper in the toilet, she was confused and threw it in front of her on the ground.                Pertinent Vitals/Pain No c/o pain during tx.        Extremity/Trunk Assessment Upper Extremity Assessment Upper Extremity Assessment: Overall WFL for  tasks assessed   Lower Extremity Assessment Lower Extremity Assessment: Defer to PT evaluation       Communication     Cognition Arousal/Alertness: Awake/alert Behavior During Therapy: Impulsive (Pt has decreased safety awareness and tried to get up out of the w/c several times.) Overall Cognitive Status: No family/caregiver present to determine baseline cognitive functioning                                Home Living Family/patient expects to be discharged to:: Private residence Living Arrangements: Alone Available Help at Discharge: Family;Other (Comment) (sons check on her daily) Type of Home: House                       Home Equipment: Kasandra Knudsen - single point   Additional Comments: Pt is a poor historian; will need more reliable info re: home setup, assist      Prior Functioning/Environment Level of Independence: Needs assistance  Gait / Transfers Assistance Needed: Per RN note, pt walks with a cane ADL's / Homemaking Assistance Needed: At one point in conversation pt stated her sons are good cooks   Comments: will need more reliable info re: pt's PLOF    OT Diagnosis: Cognitive deficits;Altered mental status   OT Problem List: Impaired balance (sitting and/or standing);Decreased cognition;Decreased safety awareness   OT Treatment/Interventions: Cognitive remediation/compensation;Balance training;Patient/family education;Self-care/ADL training    OT Goals(Current goals can be found in the care plan section) Acute Rehab OT Goals Patient Stated Goal: unable to state  OT Frequency: Min 2X/week              End of Session Equipment Utilized During Treatment: Gait belt  Activity Tolerance: Patient tolerated treatment well Patient left:  (In w/c at the nurses station)   Time:  -    Charges:    G-CodesLyda Perone 2014/04/21, 12:41 PM

## 2014-04-06 NOTE — Progress Notes (Signed)
Pt gone down via bed for MRI.

## 2014-04-06 NOTE — Progress Notes (Signed)
Patient ID: Tara [name]  female  UJW:[id number]    DOB: Apr 08, 1918    DOA: 04/04/2014  PCP: Ward Givens  Assessment/Plan: Principal Problem:   Acute encephalopathy, likely superimposed on underlying dementia (per daughter-in-law, was not formerly diagnosed but patient has been having memory issues and worsened in the last few months, she lives alone, sons check up on her frequently) - Still pleasantly confused,somewhat more oriented today, CT head negative for any stroke but did show it repeated small vessel chronic ischemic changes of deep cerebral white matter.  - Will continue IV antibiotics for presumed pneumonia - TSH, B12, folate, RPR for dementia workup negative . - obtain MRI of the brain to rule out any CVA or NPH.  - PT evaluation, may need higher level of care, lives alone  Active Problems:   AAA (abdominal aortic aneurysm) -CT abdomen showed 5.4 cm AAA, per daughter-in-law, family is aware of it and do not want any interventions at this time, given her age of 34     CAP (community acquired pneumonia) -patient pulled out IV, change to oral levaquin     Elevated brain natriuretic peptide (BNP) level, abnormal EKG with repolarization changes - DC IV fluids, chest x-ray showed bilateral interstitial opacities, may represent mild interstitial edema versus interstitial infection. - Patient is not having any shortness of breath or chest pain, O2 sats 96% on room air    Essential hypertension, benign -Increase Norvasc, continue Coreg     Other and unspecified hyperlipidemia -Continue Lipitor    DVT Prophylaxis: Lovenox   Code Status: full code   Family Communication: discussed with patient's son, Sequoya Hogsett, who strongly feels that patient will be more safe and supervised in a skilled nursing facility. He he states that he always feels unsafe about his mother being in the home alone with her age and memory issues. He also reported that the other son, Jolea Dolle  [facility]) is adamant about patient being home. I did call Mr. Loreley Schwall however he did not pick up his phone. Mr. Anaily Ashbaugh requested me to put in the social worker consult for skilled nursing facility in [place], [place] which is close to their house. He will discuss today with the rest of the family members and his other brother.  Disposition: Unclear  Consultants:  None   Procedures:  None   Antibiotics:  IV Zithromax    IV Rocephin   Subjective: Pleasantly confused, oriented to herself, denies any specific complaints   Objective: Weight change: -1.2 kg (-2 lb 10.3 oz)  Intake/Output Summary (Last 24 hours) at 04/06/14 1246 Last data filed at 04/05/14 1700  Gross per 24 hour  Intake     60 ml  Output      0 ml  Net     60 ml   Blood pressure 152/60, pulse 68, temperature 97.8 F (36.6 C), temperature source Oral, resp. rate 18, height 5\' 2"  (1.575 m), weight 55 kg (121 lb 4.1 oz), SpO2 98.00%.  Physical Exam: General: Alert and awake, oriented x2 CVS: S1-S2 clear, no murmur rubs or gallops Chest: clear to auscultation bilaterally, no wheezing, rales or rhonchi Abdomen: soft nontender, nondistended, normal bowel sounds  Extremities: no cyanosis, clubbing or edema noted bilaterally  Lab Results: Basic Metabolic Panel:  Recent Labs Lab 04/05/14 0325 04/06/14 0431  NA 140 143  K 3.0* 4.2  CL 102 106  CO2 22 22  GLUCOSE 130* 83  BUN 14 17  CREATININE 0.70 0.85  CALCIUM  9.3 9.1  MG 2.0  --    Liver Function Tests:  Recent Labs Lab 04/04/14 1725  AST 23  ALT 14  ALKPHOS 46  BILITOT 0.9  PROT 7.4  ALBUMIN 3.7   No results found for this basename: LIPASE, AMYLASE,  in the last 168 hours No results found for this basename: AMMONIA,  in the last 168 hours CBC:  Recent Labs Lab 04/05/14 0325 04/06/14 0431  WBC 9.5 7.8  HGB 15.0 14.1  HCT 46.0 42.7  MCV 91.1 92.4  PLT 123* 126*   Cardiac Enzymes: No results found for this basename: CKTOTAL,  CKMB, CKMBINDEX, TROPONINI,  in the last 168 hours BNP: No components found with this basename: POCBNP,  CBG:  Recent Labs Lab 04/04/14 2156  GLUCAP 107*     Micro Results: Recent Results (from the past 240 hour(s))  URINE CULTURE     Status: None   Collection Time    04/04/14  7:49 PM      Result Value Ref Range Status   Specimen Description URINE, CATHETERIZED   Final   Special Requests NONE   Final   Culture  Setup Time     Final   Value: 04/04/2014 23:10     Performed at [facility]     Final   Value: 75,000 COLONIES/ML     Performed at [facility]   Culture     Final   Value: Multiple bacterial morphotypes present, none predominant. Suggest appropriate recollection if clinically indicated.     Performed at [facility]   Report Status 04/06/2014 FINAL   Final  CULTURE, BLOOD (ROUTINE X 2)     Status: None   Collection Time    04/05/14  3:25 AM      Result Value Ref Range Status   Specimen Description BLOOD LEFT ANTECUBITAL   Final   Special Requests BOTTLES DRAWN AEROBIC AND ANAEROBIC 10CC EA   Final   Culture  Setup Time     Final   Value: 04/05/2014 09:50     Performed at [facility]   Culture     Final   Value:        BLOOD CULTURE RECEIVED NO GROWTH TO DATE CULTURE WILL BE HELD FOR 5 DAYS BEFORE ISSUING A FINAL NEGATIVE REPORT     Performed at [facility]   Report Status PENDING   Incomplete  CULTURE, BLOOD (ROUTINE X 2)     Status: None   Collection Time    04/05/14  3:34 AM      Result Value Ref Range Status   Specimen Description BLOOD LEFT WRIST   Final   Special Requests BOTTLES DRAWN AEROBIC AND ANAEROBIC 10CC EA   Final   Culture  Setup Time     Final   Value: 04/05/2014 09:50     Performed at [facility]   Culture     Final   Value:        BLOOD CULTURE RECEIVED NO GROWTH TO DATE CULTURE WILL BE HELD FOR 5 DAYS BEFORE ISSUING A FINAL NEGATIVE REPORT     Performed at [facility]   Report Status PENDING   Incomplete    Studies/Results: Ct Abdomen Pelvis Wo Contrast  04/04/2014   CLINICAL DATA:  Lethargy and increase confusion over 3 days. Altered mental status. Hematuria.  EXAM: CT ABDOMEN AND PELVIS WITHOUT CONTRAST  TECHNIQUE: Multidetector CT imaging of  the abdomen and pelvis was performed following the standard protocol without IV contrast.  COMPARISON:  07/25/2011  FINDINGS: Lung bases demonstrated a minimal right pleural effusion. There is motion artifact in the lung bases but there are changes consistent with diffuse interstitial infiltration or edema with bronchial thickening suggesting chronic bronchitis or airways disease. Coronary artery calcifications.  The kidneys are symmetrical in size and shape. There is evidence of bilateral parenchymal atrophy. No pyelocaliectasis or ureterectasis. Central renal vascular calcifications are present. No renal, ureteral, or bladder stones. No bladder wall thickening.  Cholelithiasis with multiple small stones in the gallbladder. No inflammatory changes. There is a large infrarenal abdominal aortic aneurysm measuring 5.4 cm maximal AP diameter. Extensive calcification of the abdominal aorta and branch vessels. Aneurysm is stable in size since prior study. No retroperitoneal hematoma.  The unenhanced appearance of the liver, spleen, pancreas, adrenal glands, inferior vena cava, and retroperitoneal lymph nodes is unremarkable. Stomach, small bowel, and colon are not abnormally distended. No free air or free fluid in the abdomen.  Pelvis: Uterus appears to be surgically absent. No pelvic mass or lymphadenopathy. Scattered diverticula in the sigmoid colon without evidence of diverticulitis. Appendix is not identified. No free or loculated pelvic fluid collections.  Lumbar scoliosis convex towards the right. Degenerative changes throughout the lumbar spine and hips. No destructive bone lesions appreciated.  IMPRESSION: No renal or  ureteral stone or obstruction identified. Small right pleural effusion with interstitial infiltration or edema an chronic bronchitic changes/airways disease suggested in the lung bases. Large abdominal aortic aneurysm measures 5.4 cm AP diameter, similar to previous study. Cholelithiasis.   Electronically Signed   By: Lucienne Capers M.D.   On: 04/04/2014 21:26   Dg Chest 2 View  04/04/2014   CLINICAL DATA:  Altered mental status  EXAM: CHEST  2 VIEW  COMPARISON:  05/27/2006 chest radiograph  FINDINGS: Upper limits normal heart size again noted.  New bilateral interstitial opacities may represent mild interstitial edema versus infection.  There is no evidence of focal airspace disease, pulmonary edema, suspicious pulmonary nodule/mass, pleural effusion, or pneumothorax. No acute bony abnormalities are identified.  IMPRESSION: Bilateral interstitial opacities this may represent mild interstitial edema versus interstitial infection.  Upper limits normal heart size.   Electronically Signed   By: Hassan Rowan M.D.   On: 04/04/2014 18:26   Ct Head Wo Contrast  04/04/2014   CLINICAL DATA:  Lethargy, increased confusion, history breast cancer, hypertension  EXAM: CT HEAD WITHOUT CONTRAST  TECHNIQUE: Contiguous axial images were obtained from the base of the skull through the vertex without intravenous contrast.  COMPARISON:  09/18/2010  FINDINGS: Generalized atrophy.  Normal ventricular morphology.  No midline shift or mass effect.  Small vessel chronic ischemic changes of deep cerebral white matter.  No intracranial hemorrhage, mass lesion, or acute infarction.  Visualized paranasal sinuses and mastoid air cells clear.  Bones unremarkable.  Atherosclerotic calcifications at the carotid siphons bilaterally.  IMPRESSION: Atrophy with small vessel chronic ischemic changes of deep cerebral white matter.  No acute intracranial abnormalities.   Electronically Signed   By: Lavonia Dana M.D.   On: 04/04/2014 18:14     Medications: Scheduled Meds: . amLODipine  5 mg Oral Daily  . aspirin EC  81 mg Oral Daily  . atorvastatin  10 mg Oral q1800  . carvedilol  25 mg Oral BID WC  . enoxaparin (LOVENOX) injection  40 mg Subcutaneous Q24H  . feeding supplement (ENSURE COMPLETE)  237 mL Oral  BID BM  . levofloxacin  750 mg Oral Daily  . sodium chloride  3 mL Intravenous Q12H      LOS: 2 days   Tara,RIPUDEEP M.D. [facility] 04/06/2014, 12:46 PM Pager: [phone]  If 7PM-7AM, please contact night-coverage [email or web address] Password [email or web address]  **Disclaimer: This note was dictated with voice recognition software. Similar sounding words can inadvertently be transcribed and this note may contain transcription errors which may not have been corrected upon publication of note.**

## 2014-04-07 DIAGNOSIS — I714 Abdominal aortic aneurysm, without rupture, unspecified: Secondary | ICD-10-CM

## 2014-04-07 DIAGNOSIS — J189 Pneumonia, unspecified organism: Secondary | ICD-10-CM

## 2014-04-07 DIAGNOSIS — F03918 Unspecified dementia, unspecified severity, with other behavioral disturbance: Secondary | ICD-10-CM

## 2014-04-07 DIAGNOSIS — G9340 Encephalopathy, unspecified: Secondary | ICD-10-CM

## 2014-04-07 DIAGNOSIS — F0391 Unspecified dementia with behavioral disturbance: Secondary | ICD-10-CM

## 2014-04-07 DIAGNOSIS — E876 Hypokalemia: Secondary | ICD-10-CM

## 2014-04-07 LAB — CBC
HCT: 39.3 % (ref 36.0–46.0)
Hemoglobin: 12.9 g/dL (ref 12.0–15.0)
MCH: 30.1 pg (ref 26.0–34.0)
MCHC: 32.8 g/dL (ref 30.0–36.0)
MCV: 91.6 fL (ref 78.0–100.0)
PLATELETS: 116 10*3/uL — AB (ref 150–400)
RBC: 4.29 MIL/uL (ref 3.87–5.11)
RDW: 15.1 % (ref 11.5–15.5)
WBC: 7.9 10*3/uL (ref 4.0–10.5)

## 2014-04-07 LAB — BASIC METABOLIC PANEL
BUN: 23 mg/dL (ref 6–23)
CALCIUM: 9.7 mg/dL (ref 8.4–10.5)
CO2: 23 mEq/L (ref 19–32)
CREATININE: 0.91 mg/dL (ref 0.50–1.10)
Chloride: 106 mEq/L (ref 96–112)
GFR calc Af Amer: 61 mL/min — ABNORMAL LOW (ref >90)
GFR calc non Af Amer: 53 mL/min — ABNORMAL LOW (ref >90)
Glucose, Bld: 103 mg/dL — ABNORMAL HIGH (ref 70–99)
Potassium: 4 mEq/L (ref 3.7–5.3)
Sodium: 144 mEq/L (ref 137–147)

## 2014-04-07 LAB — URINALYSIS, ROUTINE W REFLEX MICROSCOPIC
BILIRUBIN URINE: NEGATIVE
Glucose, UA: NEGATIVE mg/dL
KETONES UR: 15 mg/dL — AB
NITRITE: NEGATIVE
PROTEIN: 30 mg/dL — AB
Specific Gravity, Urine: 1.034 — ABNORMAL HIGH (ref 1.005–1.030)
Urobilinogen, UA: 0.2 mg/dL (ref 0.0–1.0)
pH: 5 (ref 5.0–8.0)

## 2014-04-07 LAB — URINE MICROSCOPIC-ADD ON

## 2014-04-07 NOTE — Progress Notes (Signed)
[facility] PROGRESS NOTE  Tara [name] [id number] DOB: December 13, 1917 DOA: 04/04/2014 PCP: Ward Givens  Assessment/Plan:  Acute encephalopathy secondary to urinary tract infection? Possible pneumonia, likely superimposed on underlying dementia,, TSH, B12, folate negative MRI shows moderate chronic small vessel ischemic disease Sons agreeable to SNF and requesting [facility]   AAA (abdominal aortic aneurysm)  -CT abdomen showed 5.4 cm AAA, per daughter-in-law, family is aware of it and do not want any interventions at this time, given her age of 65   CAP (community acquired pneumonia)  -patient pulled out IV, change to oral levaquin for another 5 days   Elevated brain natriuretic peptide (BNP) level, abnormal EKG with repolarization changes  - DC IV fluids, chest x-ray showed bilateral interstitial opacities, may represent mild interstitial edema versus interstitial infection.  - Patient is not having any shortness of breath or chest pain, O2 sats 96% on room air   Essential hypertension, benign  -Increase Norvasc, continue Coreg  Other and unspecified hyperlipidemia  -Continue Lipitor     DVT Prophylaxis: Lovenox  Code Status: full code  Family Communication: discussed with patient's son, Tara [name], who strongly feels that patient will be more safe and supervised in a skilled nursing facility. Both sons agree   Consultants:  None  Procedures:  None    Antibiotics:   azithromycin, Rocephin, now levofloxacin  HPI/Subjective: Patient oriented to self, confused,  Objective: Filed Vitals:   04/07/14 0843  BP: 121/79  Pulse: 70  Temp: 98 F (36.7 C)  Resp: 17    Intake/Output Summary (Last 24 hours) at 04/07/14 1017 Last data filed at 04/07/14 4481  Gross per 24 hour  Intake    320 ml  Output      0 ml  Net    320 ml   Filed Weights   04/05/14 0011 04/05/14 2142 04/06/14 2059  Weight: 56.2 kg (123 lb 14.4 oz) 55 kg (121 lb 4.1 oz)  54.432 kg (120 lb)    Exam:  General: Alert and awake, oriented x1  CVS: S1-S2 clear, no murmur rubs or gallops  Chest: clear to auscultation bilaterally, no wheezing, rales or rhonchi  Abdomen: soft nontender, nondistended, normal bowel sounds  Extremities: no cyanosis, clubbing or edema noted bilaterally      Data Reviewed: Basic Metabolic Panel:  Recent Labs Lab 04/04/14 1725 04/05/14 0325 04/06/14 0431 04/07/14 0524  NA 145 140 143 144  K 3.5* 3.0* 4.2 4.0  CL 106 102 106 106  CO2 25 22 22 23   GLUCOSE 115* 130* 83 103*  BUN 23 14 17 23   CREATININE 1.01 0.70 0.85 0.91  CALCIUM 10.4 9.3 9.1 9.7  MG  --  2.0  --   --    Liver Function Tests:  Recent Labs Lab 04/04/14 1725  AST 23  ALT 14  ALKPHOS 46  BILITOT 0.9  PROT 7.4  ALBUMIN 3.7   No results found for this basename: LIPASE, AMYLASE,  in the last 168 hours No results found for this basename: AMMONIA,  in the last 168 hours CBC:  Recent Labs Lab 04/04/14 1725 04/05/14 0325 04/06/14 0431 04/07/14 0524  WBC 8.1 9.5 7.8 7.9  HGB 14.5 15.0 14.1 12.9  HCT 44.0 46.0 42.7 39.3  MCV 91.5 91.1 92.4 91.6  PLT 126* 123* 126* 116*   Cardiac Enzymes: No results found for this basename: CKTOTAL, CKMB, CKMBINDEX, TROPONINI,  in the last 168 hours BNP (last 3 results)  Recent Labs  04/05/14 0325  PROBNP 2166.0*   CBG:  Recent Labs Lab 04/04/14 2156  GLUCAP 107*    Recent Results (from the past 240 hour(s))  URINE CULTURE     Status: None   Collection Time    04/04/14  7:49 PM      Result Value Ref Range Status   Specimen Description URINE, CATHETERIZED   Final   Special Requests NONE   Final   Culture  Setup Time     Final   Value: 04/04/2014 23:10     Performed at [facility]     Final   Value: 75,000 COLONIES/ML     Performed at [facility]   Culture     Final   Value: Multiple bacterial morphotypes present, none predominant. Suggest appropriate  recollection if clinically indicated.     Performed at [facility]   Report Status 04/06/2014 FINAL   Final  CULTURE, BLOOD (ROUTINE X 2)     Status: None   Collection Time    04/05/14  3:25 AM      Result Value Ref Range Status   Specimen Description BLOOD LEFT ANTECUBITAL   Final   Special Requests BOTTLES DRAWN AEROBIC AND ANAEROBIC 10CC EA   Final   Culture  Setup Time     Final   Value: 04/05/2014 09:50     Performed at [facility]   Culture     Final   Value:        BLOOD CULTURE RECEIVED NO GROWTH TO DATE CULTURE WILL BE HELD FOR 5 DAYS BEFORE ISSUING A FINAL NEGATIVE REPORT     Performed at [facility]   Report Status PENDING   Incomplete  CULTURE, BLOOD (ROUTINE X 2)     Status: None   Collection Time    04/05/14  3:34 AM      Result Value Ref Range Status   Specimen Description BLOOD LEFT WRIST   Final   Special Requests BOTTLES DRAWN AEROBIC AND ANAEROBIC 10CC EA   Final   Culture  Setup Time     Final   Value: 04/05/2014 09:50     Performed at [facility]   Culture     Final   Value:        BLOOD CULTURE RECEIVED NO GROWTH TO DATE CULTURE WILL BE HELD FOR 5 DAYS BEFORE ISSUING A FINAL NEGATIVE REPORT     Performed at [facility]   Report Status PENDING   Incomplete     Studies: Mr Brain Wo Contrast  04/06/2014   CLINICAL DATA:  Worsening confusion and altered mental status. Possible underlying dementia. Evaluate for stroke.  EXAM: MRI HEAD WITHOUT CONTRAST  TECHNIQUE: Multiplanar, multiecho pulse sequences of the brain and surrounding structures were obtained without intravenous contrast.  COMPARISON:  Head CT 04/04/2014  FINDINGS: There is no evidence of acute infarct, intracranial hemorrhage, mass, midline shift, or extra-axial fluid collection. There is mild-to-moderate generalized cerebral atrophy. T2 hyperintensities in the periventricular greater than subcortical cerebral white matter bilaterally are compatible with  moderate chronic small vessel ischemic disease.  Prior bilateral cataract extraction is noted. Paranasal sinuses and mastoid air cells are clear. Major intracranial vascular flow voids are preserved.  IMPRESSION: 1. No evidence of acute intracranial abnormality. 2. Moderate chronic small vessel ischemic disease.   Electronically Signed   By: Logan Bores   On: 04/06/2014 19:19    Scheduled Meds: . amLODipine  10  mg Oral Daily  . aspirin EC  81 mg Oral Daily  . atorvastatin  10 mg Oral q1800  . carvedilol  25 mg Oral BID WC  . enoxaparin (LOVENOX) injection  40 mg Subcutaneous Q24H  . feeding supplement (ENSURE COMPLETE)  237 mL Oral BID BM  . levofloxacin  750 mg Oral Q48H  . sodium chloride  3 mL Intravenous Q12H   Continuous Infusions:   Principal Problem:   Acute encephalopathy Active Problems:   AAA (abdominal aortic aneurysm)   Dementia with behavioral disturbance   CAP (community acquired pneumonia)   Hypokalemia   Elevated brain natriuretic peptide (BNP) level   Essential hypertension, benign   Other and unspecified hyperlipidemia    Time spent: 30 minutes    [facility] Pager [phone]. If 7PM-7AM, please contact night-coverage at [email or web address], password [email or web address] 04/07/2014, 10:17 AM  LOS: 3 days

## 2014-04-07 NOTE — Progress Notes (Signed)
Physical Therapy Treatment Patient Details Name: Tara [name] MRN: [id number] DOB: [birth year] Today's Date: 04/07/2014    History of Present Illness Pt is 80 yo female with dementia, HTN, HLD, presented to [facility] ED with main concern of progressively worsening confusion. Please note that pt is unable to provide history due to confusion and dementia and son at bedside provided some information. He says pt lives at home alone and is independent, carries out ADL with no assistance. He has noted several days duration of progressively worsening confusion, poor oral intake, weakness, immobility, urinary frequency. He is not aware of fevers, chills, abdominal concerns, he has not noticed any specific focal neurological symptoms such as facial asymmetry, focal weakness. In ED, pt noted to be pleasantly confused, following some simple commands but unable to answer most of the questions and unable to provide any history. BP noted to be 200/70. CXR with ? PNA.    PT Comments    Making gains in mobility and activity tolerance; Continue to recommend SNF for dc  Follow Up Recommendations  SNF;Supervision/Assistance - 24 hour     Equipment Recommendations  Rolling walker with 5" wheels;3in1 (PT)    Recommendations for Other Services       Precautions / Restrictions Precautions Precautions: Fall    Mobility  Bed Mobility Overal bed mobility: Needs Assistance Bed Mobility: Supine to Sit     Supine to sit: Supervision     General bed mobility comments: Pt initiated getting OOB impulsively without calling for assist; min assist to safely reach sitting  Transfers Overall transfer level: Needs assistance Equipment used: 1 person hand held assist Transfers: Sit to/from Stand Sit to Stand: Min assist         General transfer comment: Min assist to steady; much improved from eval, with less posterior lean  Ambulation/Gait Ambulation/Gait assistance: Min assist Ambulation Distance (Feet): 300  Feet (greater than) Assistive device: 1 person hand held assist (and hallway rail) Gait Pattern/deviations: Decreased stride length;Narrow base of support (Right LE toe out) Gait velocity: decr   General Gait Details: Performs better with L hand support; reported tired at the end of her walk   Stairs            Wheelchair Mobility    Modified Rankin (Stroke Patients Only)       Balance     Sitting balance-Leahy Scale: Fair       Standing balance-Leahy Scale: Fair                      Cognition Arousal/Alertness: Awake/alert Behavior During Therapy: Impulsive Overall Cognitive Status: No family/caregiver present to determine baseline cognitive functioning                      Exercises      General Comments        Pertinent Vitals/Pain no apparent distress     Home Living                      Prior Function            PT Goals (current goals can now be found in the care plan section) Acute Rehab PT Goals Patient Stated Goal: unable to state PT Goal Formulation: Patient unable to participate in goal setting Time For Goal Achievement: 04/12/14 Potential to Achieve Goals: Good Progress towards PT goals: Progressing toward goals    Frequency  Min 3X/week    PT Plan  Current plan remains appropriate    Co-evaluation             End of Session Equipment Utilized During Treatment: Gait belt Activity Tolerance: Patient tolerated treatment well Patient left: in chair;with call bell/phone within reach;with chair alarm set;with family/visitor present     Time: 0034-9179 PT Time Calculation (min): 25 min  Charges:  $Gait Training: 23-37 mins                    G Codes:      Quin Hoop 04/07/2014, 4:10 PM Roney Marion, [place] Pager [phone] Office [phone]

## 2014-04-07 NOTE — Clinical Social Work Note (Signed)
CSW rec'd consult for SNF placement and assessment completed with son Rosellen Lichtenberger (full assessment to follow). Family will be provided with bed offers on Thursday for d/c to facility.  Crawford Givens, MSW, LCSW [phone]

## 2014-04-08 MED ORDER — LEVOFLOXACIN 750 MG PO TABS: 750.0000 mg | ORAL_TABLET | ORAL | Status: AC

## 2014-04-08 MED ORDER — AMLODIPINE BESYLATE 10 MG PO TABS: 10.0000 mg | ORAL_TABLET | Freq: Every day | ORAL | Status: AC

## 2014-04-08 MED ORDER — ENSURE COMPLETE PO LIQD: 237.0000 mL | Freq: Two times a day (BID) | ORAL | Status: AC

## 2014-04-08 MED ORDER — ALBUTEROL SULFATE (2.5 MG/3ML) 0.083% IN NEBU: 2.5000 mg | INHALATION_SOLUTION | RESPIRATORY_TRACT | Status: AC | PRN

## 2014-04-08 NOTE — Progress Notes (Signed)
Pt discharged to snf per MD. Report called to Peter Congo at [facility]. [facility] providing transport. Manya Silvas, RN

## 2014-04-08 NOTE — Clinical Social Work Psychosocial (Signed)
Clinical Social Work Department BRIEF PSYCHOSOCIAL ASSESSMENT 04/08/2014  Patient:  Tara [name], Tara [name]     Account Number:  [id number]     Admit date:  04/04/2014  Clinical Social Worker:  Frederico Hamman  Date/Time:  04/15/2014 02:27 AM  Referred by:  Physician  Date Referred:  04/07/2014 Referred for  SNF Placement   Other Referral:   Interview type:  Family Other interview type:    PSYCHOSOCIAL DATA Living Status:  ALONE Admitted from facility:   Level of care:   Primary support name:  Tara [name] Primary support relationship to patient:  CHILD, ADULT Degree of support available:   Both son, Tara [name] and Tara [name] are very involved and concerned about patient's wellbeing.    CURRENT CONCERNS Current Concerns  Post-Acute Placement   Other Concerns:    SOCIAL WORK ASSESSMENT / PLAN On 04/07/14, CSW talked with patient's son Tara [name] [phone]) at the bedside. CSW acknowledged patient, however she was not able to participate in the discussion of discharge planning. Mr. Basden was expecting CSW and he and brother are in agreement with ST rehab. Their preferences are [facility] in  or [facility]. They are aware that patient will be ready for discharge on Thursday, 6/25.   Assessment/plan status:  Psychosocial Support/Ongoing Assessment of Needs Other assessment/ plan:   Information/referral to community resources:   None needed or requested at this time.    PATIENT'S/FAMILY'S RESPONSE TO PLAN OF CARE: The patient cheerful and pleasant and warmly greeted CSW. Son receptive to talking with CSW about ST rehab.

## 2014-04-08 NOTE — Discharge Summary (Signed)
Physician Discharge Summary  Tara [name] MRN: [id number] DOB/AGE: 1917/12/23 80 y.o.  PCP: Ward Givens   Admit date: 04/04/2014 Discharge date: 04/08/2014  Discharge Diagnoses:   :   Acute encephalopathy  :   AAA (abdominal aortic aneurysm)   Dementia with behavioral disturbance   CAP (community acquired pneumonia)   Hypokalemia   Elevated brain natriuretic peptide (BNP) level   Essential hypertension, benign   Other and unspecified hyperlipidemia  Followup recommendations Repeat CBC, BMP in one week to follow platelet count and potassium Followup with PCP in 1 week     Medication List         albuterol (2.5 MG/3ML) 0.083% nebulizer solution  Commonly known as:  PROVENTIL  Take 3 mLs (2.5 mg total) by nebulization every 2 (two) hours as needed for wheezing.     amLODipine 10 MG tablet  Commonly known as:  NORVASC  Take 1 tablet (10 mg total) by mouth daily.     aspirin EC 81 MG tablet  Take 81 mg by mouth daily.     carvedilol 25 MG tablet  Commonly known as:  COREG  Take 25 mg by mouth 2 (two) times daily with a meal.     feeding supplement (ENSURE COMPLETE) Liqd  Take 237 mLs by mouth 2 (two) times daily between meals.     fish oil-omega-3 fatty acids 1000 MG capsule  Take 2 g by mouth daily.     Garlic 607 MG Caps  Take 600 mg by mouth 2 (two) times daily.     levofloxacin 750 MG tablet  Commonly known as:  LEVAQUIN  Take 1 tablet (750 mg total) by mouth every other day.     rosuvastatin 40 MG tablet  Commonly known as:  CRESTOR  Take 40 mg by mouth daily.     traZODone 100 MG tablet  Commonly known as:  DESYREL  Take 100 mg by mouth at bedtime as needed for sleep.        Discharge Condition: Stable  Disposition: Final discharge disposition not confirmed   Consults:  None  Significant Diagnostic Studies: Ct Abdomen Pelvis Wo Contrast  04/04/2014   CLINICAL DATA:  Lethargy and increase confusion over 3 days. Altered mental  status. Hematuria.  EXAM: CT ABDOMEN AND PELVIS WITHOUT CONTRAST  TECHNIQUE: Multidetector CT imaging of the abdomen and pelvis was performed following the standard protocol without IV contrast.  COMPARISON:  07/25/2011  FINDINGS: Lung bases demonstrated a minimal right pleural effusion. There is motion artifact in the lung bases but there are changes consistent with diffuse interstitial infiltration or edema with bronchial thickening suggesting chronic bronchitis or airways disease. Coronary artery calcifications.  The kidneys are symmetrical in size and shape. There is evidence of bilateral parenchymal atrophy. No pyelocaliectasis or ureterectasis. Central renal vascular calcifications are present. No renal, ureteral, or bladder stones. No bladder wall thickening.  Cholelithiasis with multiple small stones in the gallbladder. No inflammatory changes. There is a large infrarenal abdominal aortic aneurysm measuring 5.4 cm maximal AP diameter. Extensive calcification of the abdominal aorta and branch vessels. Aneurysm is stable in size since prior study. No retroperitoneal hematoma.  The unenhanced appearance of the liver, spleen, pancreas, adrenal glands, inferior vena cava, and retroperitoneal lymph nodes is unremarkable. Stomach, small bowel, and colon are not abnormally distended. No free air or free fluid in the abdomen.  Pelvis: Uterus appears to be surgically absent. No pelvic mass or lymphadenopathy. Scattered diverticula in the sigmoid colon without  evidence of diverticulitis. Appendix is not identified. No free or loculated pelvic fluid collections.  Lumbar scoliosis convex towards the right. Degenerative changes throughout the lumbar spine and hips. No destructive bone lesions appreciated.  IMPRESSION: No renal or ureteral stone or obstruction identified. Small right pleural effusion with interstitial infiltration or edema an chronic bronchitic changes/airways disease suggested in the lung bases. Large  abdominal aortic aneurysm measures 5.4 cm AP diameter, similar to previous study. Cholelithiasis.   Electronically Signed   By: Lucienne Capers M.D.   On: 04/04/2014 21:26   Dg Chest 2 View  04/04/2014   CLINICAL DATA:  Altered mental status  EXAM: CHEST  2 VIEW  COMPARISON:  05/27/2006 chest radiograph  FINDINGS: Upper limits normal heart size again noted.  New bilateral interstitial opacities may represent mild interstitial edema versus infection.  There is no evidence of focal airspace disease, pulmonary edema, suspicious pulmonary nodule/mass, pleural effusion, or pneumothorax. No acute bony abnormalities are identified.  IMPRESSION: Bilateral interstitial opacities this may represent mild interstitial edema versus interstitial infection.  Upper limits normal heart size.   Electronically Signed   By: Hassan Rowan M.D.   On: 04/04/2014 18:26   Ct Head Wo Contrast  04/04/2014   CLINICAL DATA:  Lethargy, increased confusion, history breast cancer, hypertension  EXAM: CT HEAD WITHOUT CONTRAST  TECHNIQUE: Contiguous axial images were obtained from the base of the skull through the vertex without intravenous contrast.  COMPARISON:  09/18/2010  FINDINGS: Generalized atrophy.  Normal ventricular morphology.  No midline shift or mass effect.  Small vessel chronic ischemic changes of deep cerebral white matter.  No intracranial hemorrhage, mass lesion, or acute infarction.  Visualized paranasal sinuses and mastoid air cells clear.  Bones unremarkable.  Atherosclerotic calcifications at the carotid siphons bilaterally.  IMPRESSION: Atrophy with small vessel chronic ischemic changes of deep cerebral white matter.  No acute intracranial abnormalities.   Electronically Signed   By: Lavonia Dana M.D.   On: 04/04/2014 18:14   Mr Brain Wo Contrast  04/06/2014   CLINICAL DATA:  Worsening confusion and altered mental status. Possible underlying dementia. Evaluate for stroke.  EXAM: MRI HEAD WITHOUT CONTRAST  TECHNIQUE:  Multiplanar, multiecho pulse sequences of the brain and surrounding structures were obtained without intravenous contrast.  COMPARISON:  Head CT 04/04/2014  FINDINGS: There is no evidence of acute infarct, intracranial hemorrhage, mass, midline shift, or extra-axial fluid collection. There is mild-to-moderate generalized cerebral atrophy. T2 hyperintensities in the periventricular greater than subcortical cerebral white matter bilaterally are compatible with moderate chronic small vessel ischemic disease.  Prior bilateral cataract extraction is noted. Paranasal sinuses and mastoid air cells are clear. Major intracranial vascular flow voids are preserved.  IMPRESSION: 1. No evidence of acute intracranial abnormality. 2. Moderate chronic small vessel ischemic disease.   Electronically Signed   By: Logan Bores   On: 04/06/2014 19:19      Microbiology: Recent Results (from the past 240 hour(s))  URINE CULTURE     Status: None   Collection Time    04/04/14  7:49 PM      Result Value Ref Range Status   Specimen Description URINE, CATHETERIZED   Final   Special Requests NONE   Final   Culture  Setup Time     Final   Value: 04/04/2014 23:10     Performed at [facility]     Final   Value: 75,000 COLONIES/ML     Performed at  [facility]     Final   Value: Multiple bacterial morphotypes present, none predominant. Suggest appropriate recollection if clinically indicated.     Performed at [facility]   Report Status 04/06/2014 FINAL   Final  CULTURE, BLOOD (ROUTINE X 2)     Status: None   Collection Time    04/05/14  3:25 AM      Result Value Ref Range Status   Specimen Description BLOOD LEFT ANTECUBITAL   Final   Special Requests BOTTLES DRAWN AEROBIC AND ANAEROBIC 10CC EA   Final   Culture  Setup Time     Final   Value: 04/05/2014 09:50     Performed at [facility]   Culture     Final   Value:        BLOOD CULTURE RECEIVED NO GROWTH  TO DATE CULTURE WILL BE HELD FOR 5 DAYS BEFORE ISSUING A FINAL NEGATIVE REPORT     Performed at [facility]   Report Status PENDING   Incomplete  CULTURE, BLOOD (ROUTINE X 2)     Status: None   Collection Time    04/05/14  3:34 AM      Result Value Ref Range Status   Specimen Description BLOOD LEFT WRIST   Final   Special Requests BOTTLES DRAWN AEROBIC AND ANAEROBIC 10CC EA   Final   Culture  Setup Time     Final   Value: 04/05/2014 09:50     Performed at [facility]   Culture     Final   Value:        BLOOD CULTURE RECEIVED NO GROWTH TO DATE CULTURE WILL BE HELD FOR 5 DAYS BEFORE ISSUING A FINAL NEGATIVE REPORT     Performed at [facility]   Report Status PENDING   Incomplete     Labs: Results for orders placed during the hospital encounter of 04/04/14 (from the past 48 hour(s))  CBC     Status: Abnormal   Collection Time    04/07/14  5:24 AM      Result Value Ref Range   WBC 7.9  4.0 - 10.5 K/uL   RBC 4.29  3.87 - 5.11 MIL/uL   Hemoglobin 12.9  12.0 - 15.0 g/dL   HCT 21.0  31.2 - 81.1 %   MCV 91.6  78.0 - 100.0 fL   MCH 30.1  26.0 - 34.0 pg   MCHC 32.8  30.0 - 36.0 g/dL   RDW 88.6  77.3 - 73.6 %   Platelets 116 (*) 150 - 400 K/uL   Comment: PLATELET COUNT CONFIRMED BY SMEAR     REPEATED TO VERIFY  BASIC METABOLIC PANEL     Status: Abnormal   Collection Time    04/07/14  5:24 AM      Result Value Ref Range   Sodium 144  137 - 147 mEq/L   Potassium 4.0  3.7 - 5.3 mEq/L   Chloride 106  96 - 112 mEq/L   CO2 23  19 - 32 mEq/L   Glucose, Bld 103 (*) 70 - 99 mg/dL   BUN 23  6 - 23 mg/dL   Creatinine, Ser 6.81  0.50 - 1.10 mg/dL   Calcium 9.7  8.4 - 59.4 mg/dL   GFR calc non Af Amer 53 (*) >90 mL/min   GFR calc Af Amer 61 (*) >90 mL/min   Comment: (NOTE)     The eGFR has been  calculated using the CKD EPI equation.     This calculation has not been validated in all clinical situations.     eGFR's persistently <90 mL/min signify possible Chronic  Kidney     Disease.  URINALYSIS, ROUTINE W REFLEX MICROSCOPIC     Status: Abnormal   Collection Time    04/07/14  6:55 AM      Result Value Ref Range   Color, Urine AMBER (*) YELLOW   Comment: BIOCHEMICALS MAY BE AFFECTED BY COLOR   APPearance CLOUDY (*) CLEAR   Specific Gravity, Urine 1.034 (*) 1.005 - 1.030   pH 5.0  5.0 - 8.0   Glucose, UA NEGATIVE  NEGATIVE mg/dL   Hgb urine dipstick LARGE (*) NEGATIVE   Bilirubin Urine NEGATIVE  NEGATIVE   Ketones, ur 15 (*) NEGATIVE mg/dL   Protein, ur 30 (*) NEGATIVE mg/dL   Urobilinogen, UA 0.2  0.0 - 1.0 mg/dL   Nitrite NEGATIVE  NEGATIVE   Leukocytes, UA SMALL (*) NEGATIVE  URINE MICROSCOPIC-ADD ON     Status: Abnormal   Collection Time    04/07/14  6:55 AM      Result Value Ref Range   Squamous Epithelial / LPF FEW (*) RARE   WBC, UA 21-50  <3 WBC/hpf   RBC / HPF TOO NUMEROUS TO COUNT  <3 RBC/hpf   Bacteria, UA FEW (*) RARE   Crystals SULFONAMIDE CRYSTALS (*) NEGATIVE   Urine-Other AMORPHOUS URATES/PHOSPHATES     Comment: MUCOUS PRESENT     HPI  80 yo female with dementia, HTN, HLD, presented to [facility] ED with main concern of progressively worsening confusion. Please note that pt is unable to provide history due to confusion and dementia and son at bedside provided some information. He says pt lives at home alone and is independent, carries out ADL with no assistance. He has noted several days duration of progressively worsening confusion, poor oral intake, weakness, immobility, urinary frequency. He is not aware of fevers, chills, abdominal concerns, he has not noticed any specific focal neurological symptoms such as facial asymmetry, focal weakness.  In ED, pt noted to be pleasantly confused, following some simple commands but unable to answer most of the questions and unable to provide nay history. BP noted to be 200/70. CXR suggestive of pneumonia in bilateral interstitial opacities, CT scan of the abdomen showed small right pleural  effusion with interstitial infiltration, chronic bronchitis changes and a large abdominal aneurysm TR[facility] asked to admit for further evaluation.    HOSPITAL COURSE:   Acute encephalopathy secondary to urine tract infection/pneumonia, likely superimposed on underlying dementia although she has not been formally diagnosed with this but has been having memory issues for the last several months  TSH, B12, folate negative  MRI shows moderate chronic small vessel ischemic disease  Sons agreeable to SNF and requesting [facility]    AAA (abdominal aortic aneurysm)  -CT abdomen showed 5.4 cm AAA, per daughter-in-law, family is aware of it and do not want any interventions at this time, given her age of 63    CAP (community acquired pneumonia)  Initially treated for pneumonia with Rocephin and Zithromax Switched to levofloxacin every 48 hours for another week   Elevated brain natriuretic peptide (BNP) level, abnormal EKG with repolarization changes  chest x-ray showed bilateral interstitial opacities, may represent mild interstitial edema versus interstitial infection.  - Patient is not having any shortness of breath or chest pain, O2 sats 96% on room air  Essential hypertension, benign  Increase Norvasc to 10 mg during this admission continue Coreg    Other and unspecified hyperlipidemia  -Continue Lipitor       Physical examination Blood pressure 134/69, pulse 74, temperature 98.1 F (36.7 C), temperature source Oral, resp. rate 18, height $RemoveBe'5\' 2"'OIrVpcvVy$  (1.575 m), weight 55.1 kg (121 lb 7.6 oz), SpO2 93.00%.  General: Alert and awake, oriented x1  CVS: S1-S2 clear, no murmur rubs or gallops  Chest: clear to auscultation bilaterally, no wheezing, rales or rhonchi  Abdomen: soft nontender, nondistended, normal bowel sounds  Extremities: no cyanosis, clubbing or edema noted bilaterally        Discharge Instructions   Diet - low sodium heart healthy    Complete by:  As  directed      Increase activity slowly    Complete by:  As directed            Follow-up Information   Follow up with SPRY,HEATHER. Schedule an appointment as soon as possible for a visit in 1 week.   Specialty:  Family Medicine      Signed: Reyne Dumas 04/08/2014, 11:43 AM

## 2014-04-08 NOTE — Clinical Social Work Placement (Signed)
Clinical Social Work Department CLINICAL SOCIAL WORK PLACEMENT NOTE 04/08/2014  Patient:  Tara [name], Tara [name]  Account Number:  [id number] Admit date:  04/04/2014  Clinical Social Worker:  Crawford Givens, LCSW  Date/time:  04/08/2014 02:33 AM  Clinical Social Work is seeking post-discharge placement for this patient at the following level of care:   SKILLED NURSING   (*CSW will update this form in Epic as items are completed)   04/07/2014  Patient/family provided with [place] Department of Clinical Social Work's list of facilities offering this level of care within the geographic area requested by the patient (or if unable, by the patient's family).  04/07/2014  Patient/family informed of their freedom to choose among providers that offer the needed level of care, that participate in Medicare, Medicaid or managed care program needed by the patient, have an available bed and are willing to accept the patient.    Patient/family informed of [facility]' ownership interest in [facility], as well as of the fact that they are under no obligation to receive care at this facility.  PASARR submitted to EDS on 04/08/2014 PASARR number received on 04/08/2014  FL2 transmitted to all facilities in geographic area requested by pt/family on  04/07/2014 FL2 transmitted to all facilities within larger geographic area on   Patient informed that his/her managed care company has contracts with or will negotiate with  certain facilities, including the following:     Patient/family informed of bed offers received:  04/08/2014 Patient chooses bed at [facility] in [place] recommends and patient chooses bed at    Patient to be transferred to  on  04/08/2014 Patient to be transferred to facility by ambulance Patient and family notified of transfer on 04/08/2014 Name of family member notified:  Son, Delesa Kawa  The following physician request were entered in  Epic:   Additional Comments: 04/08/14: Patient discharged to [facility] skilled nursing facility.

## 2014-04-11 LAB — CULTURE, BLOOD (ROUTINE X 2)
Culture: NO GROWTH
Culture: NO GROWTH

## 2014-04-26 ENCOUNTER — Other Ambulatory Visit: Payer: Self-pay | Admitting: *Deleted

## 2014-04-26 DIAGNOSIS — I714 Abdominal aortic aneurysm, without rupture, unspecified: Secondary | ICD-10-CM

## 2014-05-07 ENCOUNTER — Encounter: Payer: Self-pay | Admitting: Family

## 2014-05-10 ENCOUNTER — Ambulatory Visit (HOSPITAL_COMMUNITY)
Admission: RE | Admit: 2014-05-10 | Discharge: 2014-05-10 | Disposition: A | Discharge status: Home / Self Care | Payer: No Typology Code available for payment source | Source: Ambulatory Visit | Attending: Family | Admitting: Family

## 2014-05-10 ENCOUNTER — Other Ambulatory Visit: Payer: Self-pay

## 2014-05-10 ENCOUNTER — Ambulatory Visit (INDEPENDENT_AMBULATORY_CARE_PROVIDER_SITE_OTHER): Payer: Medicare Other | Admitting: Family

## 2014-05-10 ENCOUNTER — Encounter: Payer: Self-pay | Admitting: Family

## 2014-05-10 ENCOUNTER — Telehealth: Payer: Self-pay | Admitting: Vascular Surgery

## 2014-05-10 VITALS — BP 138/67 | HR 72 | Resp 16 | Ht 64.0 in | Wt 125.0 lb

## 2014-05-10 DIAGNOSIS — I714 Abdominal aortic aneurysm, without rupture, unspecified: Secondary | ICD-10-CM

## 2014-05-10 DIAGNOSIS — M7989 Other specified soft tissue disorders: Secondary | ICD-10-CM

## 2014-05-10 NOTE — Progress Notes (Signed)
[facility] [place]  Established Abdominal Aortic Aneurysm  History of Present Illness  Tara [name] is a 80 y.o. (16-Sep-1918) female patient of Dr. Oneida Alar followed for known AAA  who presents for follow up.  Previous studies demonstrate an AAA, measuring 4.93 cm.  The patient does not have back or abdominal pain.  The patient is not a smoker. The patient denies claudication in legs with walking, denies non healing wounds in feet or legs. The patient denies history of stroke or TIA symptoms. She takes a statin, ASA, and a beta blocker.  She is a resident of [place].  Son states pt is unsteady on her feet.  Pt Diabetic: No Pt smoker: non-smoker  Past Medical History  Diagnosis Date  . AAA (abdominal aortic aneurysm)   . Heart murmur   . Hyperlipidemia   . Arthritis   . Joint pain   . Hypertension   . Osteoporosis   . Insomnia   . Allergic rhinitis   . Anemia   . Atrial fibrillation   . Cancer     breast-Left   Past Surgical History  Procedure Laterality Date  . Mastectomy      Left breast  . Abdominal hysterectomy    . Fracture surgery      arm   Social History History   Social History  . Marital Status: Widowed    Spouse Name: N/A    Number of Children: N/A  . Years of Education: N/A   Occupational History  . Not on file.   Social History Main Topics  . Smoking status: Never Smoker   . Smokeless tobacco: Never Used  . Alcohol Use: No  . Drug Use: No  . Sexual Activity: No   Other Topics Concern  . Not on file   Social History Narrative  . No narrative on file   Family History Family History  Problem Relation Age of Onset  . Cancer Sister   . Hypertension Sister   . Diabetes Brother   . Heart disease Brother     NOT  before age 72  . Hypertension Brother   . Heart attack Brother   . Diabetes Sister   . Hypertension Sister     Current Outpatient Prescriptions on File Prior to Visit  Medication  Sig Dispense Refill  . amLODipine (NORVASC) 10 MG tablet Take 1 tablet (10 mg total) by mouth daily.  30 tablet  2  . aspirin EC 81 MG tablet Take 81 mg by mouth daily.        . carvedilol (COREG) 25 MG tablet Take 25 mg by mouth 2 (two) times daily with a meal.      . feeding supplement, ENSURE COMPLETE, (ENSURE COMPLETE) LIQD Take 237 mLs by mouth 2 (two) times daily between meals.  1 Bottle  10  . fish oil-omega-3 fatty acids 1000 MG capsule Take 2 g by mouth daily.        . Garlic 308 MG CAPS Take 600 mg by mouth 2 (two) times daily.      . rosuvastatin (CRESTOR) 40 MG tablet Take 40 mg by mouth daily.      . traZODone (DESYREL) 100 MG tablet Take 100 mg by mouth at bedtime as needed for sleep.       Marland Kitchen albuterol (PROVENTIL) (2.5 MG/3ML) 0.083% nebulizer solution Take 3 mLs (2.5 mg total) by nebulization every 2 (two) hours as needed for wheezing.  75 mL  12  No current facility-administered medications on file prior to visit.   No Known Allergies  ROS: See HPI for pertinent positives and negatives.  Physical Examination  Filed Vitals:   05/10/14 1143  BP: 138/67  Pulse: 72  Resp: 16  Height: 5\' 4"  (1.626 m)  Weight: 125 lb (56.7 kg)  SpO2: 99%   Body mass index is 21.45 kg/(m^2).  General: A&O x 3, WD, using cane.  Pulmonary: Sym exp, good air movt, CTAB, no rales, rhonchi, or wheezing.  Cardiac: RRR, Nl S1, S2, positive detected murmur.   Carotid Bruits Left Right   Negative Negative   Aorta is not palpable Radial pulses are 1+ palpable and =                          VASCULAR EXAM:                                                                                                         LE Pulses LEFT RIGHT       FEMORAL  not palpable, sitting  not palpable, sitting        POPLITEAL  not palpable   not palpable       POSTERIOR TIBIAL  not palpable   not palpable        DORSALIS PEDIS      ANTERIOR TIBIAL faintly palpable  faintly palpable      Gastrointestinal: soft, NTND, -G/R, - HSM, - masses, - CVAT B.  Musculoskeletal: M/S 3/5 throughout, Extremities without ischemic changes. 2+ bilateral pitting edema in both lower legs.  Neurologic: CN 2-12 grossly intact except is hard of hearing, Motor exam as listed above.  Non-Invasive Vascular Imaging  AAA Duplex (05/10/2014)  Previous size: 4.93 cm (Date: 02/19/13)  Current size:  5.48 cm (Date: 05/10/2014)  Medical Decision Making  The patient is a 80 y.o. female who presents with asymptomatic AAA with increasing size.   Based on this patient's exam and diagnostic studies, and after discussing with Dr. Trula Slade, the patient will follow up in 1-2  weeks  with the following studies: CTA of abdomen/pelvis, discuss results with Dr. Oneida Alar.  Consideration for repair of AAA would be made when the size approaches 5.5 cm, growth > 1 cm/yr, and symptomatic status.  I emphasized the importance of maximal medical management including strict control of blood pressure, blood glucose, and lipid levels, antiplatelet agents, obtaining regular exercise, and continued cessation of smoking.   The patient was given information about AAA including signs, symptoms, treatment, and how to minimize the risk of enlargement and rupture of aneurysms.    The patient was advised to call [phone] should the patient experience sudden onset abdominal or back pain.   Thank you for allowing [place] to participate in this patient's care.  Clemon Chambers, RN, MSN, FNP-C [facility] [place] Office: [facility] Physician: Trula Slade  05/10/2014, 12:08 PM

## 2014-05-10 NOTE — Telephone Encounter (Signed)
Gave son Tim appt info: CTA at [facility] 05/12/14 at 2pm and [facility] 05/13/14 at 1 pm.

## 2014-05-10 NOTE — Patient Instructions (Signed)
Abdominal Aortic Aneurysm An aneurysm is a weakened or damaged part of an artery wall that bulges from the normal force of blood pumping through the body. An abdominal aortic aneurysm is an aneurysm that occurs in the lower part of the aorta, the main artery of the body.  The major concern with an abdominal aortic aneurysm is that it can enlarge and burst (rupture) or blood can flow between the layers of the wall of the aorta through a tear (aorticdissection). Both of these conditions can cause bleeding inside the body and can be life threatening unless diagnosed and treated promptly. CAUSES  The exact cause of an abdominal aortic aneurysm is unknown. Some contributing factors are:   A hardening of the arteries caused by the buildup of fat and other substances in the lining of a blood vessel (arteriosclerosis).  Inflammation of the walls of an artery (arteritis).   Connective tissue diseases, such as Marfan syndrome.   Abdominal trauma.   An infection, such as syphilis or staphylococcus, in the wall of the aorta (infectious aortitis) caused by bacteria. RISK FACTORS  Risk factors that contribute to an abdominal aortic aneurysm may include:  Age older than 60 years.   High blood pressure (hypertension).  Female gender.  Ethnicity (white race).  Obesity.  Family history of aneurysm (first degree relatives only).  Tobacco use. PREVENTION  The following healthy lifestyle habits may help decrease your risk of abdominal aortic aneurysm:  Quitting smoking. Smoking can raise your blood pressure and cause arteriosclerosis.  Limiting or avoiding alcohol.  Keeping your blood pressure, blood sugar level, and cholesterol levels within normal limits.  Decreasing your salt intake. In somepeople, too much salt can raise blood pressure and increase your risk of abdominal aortic aneurysm.  Eating a diet low in saturated fats and cholesterol.  Increasing your fiber intake by including  whole grains, vegetables, and fruits in your diet. Eating these foods may help lower blood pressure.  Maintaining a healthy weight.  Staying physically active and exercising regularly. SYMPTOMS  The symptoms of abdominal aortic aneurysm may vary depending on the size and rate of growth of the aneurysm.Most grow slowly and do not have any symptoms. When symptoms do occur, they may include:  Pain (abdomen, side, lower back, or groin). The pain may vary in intensity. A sudden onset of severe pain may indicate that the aneurysm has ruptured.  Feeling full after eating only small amounts of food.  Nausea or vomiting or both.  Feeling a pulsating lump in the abdomen.  Feeling faint or passing out. DIAGNOSIS  Since most unruptured abdominal aortic aneurysms have no symptoms, they are often discovered during diagnostic exams for other conditions. An aneurysm may be found during the following procedures:  Ultrasonography (A one-time screening for abdominal aortic aneurysm by ultrasonography is also recommended for all men aged 65-75 years who have ever smoked).  X-ray exams.  A computed tomography (CT).  Magnetic resonance imaging (MRI).  Angiography or arteriography. TREATMENT  Treatment of an abdominal aortic aneurysm depends on the size of your aneurysm, your age, and risk factors for rupture. Medication to control blood pressure and pain may be used to manage aneurysms smaller than 6 cm. Regular monitoring for enlargement may be recommended by your caregiver if:  The aneurysm is 3-4 cm in size (an annual ultrasonography may be recommended).  The aneurysm is 4-4.5 cm in size (an ultrasonography every 6 months may be recommended).  The aneurysm is larger than 4.5 cm in   size (your caregiver may ask that you be examined by a vascular surgeon). If your aneurysm is larger than 6 cm, surgical repair may be recommended. There are two main methods for repair of an aneurysm:   Endovascular  repair (a minimally invasive surgery). This is done most often.  Open repair. This method is used if an endovascular repair is not possible. Document Released: 07/11/2005 Document Revised: 01/26/2013 Document Reviewed: 10/31/2012 Exit[employer] Patient Information 2015 [employer]. This information is not intended to replace advice given to you by your health care provider. Make sure you discuss any questions you have with your health care provider.  

## 2014-05-12 ENCOUNTER — Encounter: Payer: Self-pay | Admitting: Vascular Surgery

## 2014-05-13 ENCOUNTER — Ambulatory Visit (INDEPENDENT_AMBULATORY_CARE_PROVIDER_SITE_OTHER): Payer: Medicare Other | Admitting: Vascular Surgery

## 2014-05-13 ENCOUNTER — Encounter: Payer: Self-pay | Admitting: Vascular Surgery

## 2014-05-13 VITALS — BP 151/57 | HR 69 | Ht 64.0 in | Wt 125.0 lb

## 2014-05-13 DIAGNOSIS — I714 Abdominal aortic aneurysm, without rupture, unspecified: Secondary | ICD-10-CM

## 2014-05-13 NOTE — Addendum Note (Signed)
Addended by: Mena Goes on: 05/13/2014 03:51 PM   Modules accepted: Orders

## 2014-05-13 NOTE — Progress Notes (Signed)
She is a 80 year old female who returns for followup today for abdominal aortic aneurysm. He has had a known aneurysm that we have followed for several years. Recent ultrasound on July 27 showed a 5.4 cm aneurysm. The patient was sent for CT angiogram for confirmation. This was done at [facility] on July 27. This shows the aneurysm is 5.6 cm in diameter. 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 She has some mild dementia. She currently is living in a skilled nursing facility. She denies food fear. She denies weight loss. She denies any abdominal or back pain. She appears frail.  Past Medical History  Diagnosis Date  . AAA (abdominal aortic aneurysm)   . Heart murmur   . Hyperlipidemia   . Arthritis   . Joint pain   . Hypertension   . Osteoporosis   . Insomnia   . Allergic rhinitis   . Anemia   . Atrial fibrillation   . Cancer     breast-Left    Past Surgical History  Procedure Laterality Date  . Mastectomy      Left breast  . Abdominal hysterectomy    . Fracture surgery      arm    Current Outpatient Prescriptions on File Prior to Visit  Medication Sig Dispense Refill  . albuterol (PROVENTIL) (2.5 MG/3ML) 0.083% nebulizer solution Take 3 mLs (2.5 mg total) by nebulization every 2 (two) hours as needed for wheezing.  75 mL  12  . amLODipine (NORVASC) 10 MG tablet Take 1 tablet (10 mg total) by mouth daily.  30 tablet  2  . aspirin EC 81 MG tablet Take 81 mg by mouth daily.        . carvedilol (COREG) 25 MG tablet Take 25 mg by mouth 2 (two) times daily with a meal.      . feeding supplement, ENSURE COMPLETE, (ENSURE COMPLETE) LIQD Take 237 mLs by mouth 2 (two) times daily between meals.  1 Bottle  10  . fish oil-omega-3 fatty acids 1000 MG capsule Take 2 g by mouth daily.        . Garlic 209 MG CAPS Take 600 mg by mouth 2 (two) times daily.      . rosuvastatin (CRESTOR) 40 MG tablet Take 40 mg by mouth daily.      . traZODone  (DESYREL) 100 MG tablet Take 100 mg by mouth at bedtime as needed for sleep.        No current facility-administered medications on file prior to visit.    No Known Allergies  Review of systems: She is overall fairly inactive. She denies shortness of breath. However she does become short of breath with minimal activity.  Physical exam:  Filed Vitals:   05/13/14 1318  BP: 151/57  Pulse: 69  Height: 5\' 4"  (1.626 m)  Weight: 125 lb (56.7 kg)  SpO2: 99%    Neck: 2+ carotid pulses  Chest: Clear to auscultation bilaterally  Cardiac: Regular rate and rhythm  Abdomen: Soft nontender nondistended palpable pulsatile mass in epigastrium Extremities, 2+ femoral pulses bilaterally  Data: CT angiogram images are reviewed today. She has a fairly tortuous infrarenal neck. There is a reversed taper.  The neck is approximately 1.7 cm in length. Iliacs or 9 mm in diameter at the external iliac level. The aneurysm was 5.6 cm in diameter.  Assessment: 5.6 cm abdominal aortic aneurysm in a frail elderly female. Had lengthy discussion with the patient and  her son today. She is not a candidate for open operation. She also would not be a candidate for operation if the aneurysm ruptured. Overall the risk of rupture is low probably on the order of 5% per year. They will discuss with the family whether or not they wish to consider stent graft operation. The family has some reluctance justifiably anesthesia in operative repair. I am in agreement with this. She will followup with me in 6 months time unless they wish to consider stent graft repair before that.  Ruta Hinds, MD [facility] [place] Office: [phone] Pager: [phone]

## 2014-05-19 ENCOUNTER — Encounter: Payer: Self-pay | Admitting: Vascular Surgery

## 2014-11-11 ENCOUNTER — Other Ambulatory Visit (HOSPITAL_COMMUNITY): Payer: Medicare Other

## 2014-11-11 ENCOUNTER — Ambulatory Visit: Payer: Medicare Other | Admitting: Vascular Surgery

## 2014-12-02 ENCOUNTER — Ambulatory Visit: Payer: Medicare Other | Admitting: Vascular Surgery

## 2014-12-02 ENCOUNTER — Other Ambulatory Visit (HOSPITAL_COMMUNITY): Payer: Medicare Other

## 2014-12-08 ENCOUNTER — Encounter: Payer: Self-pay | Admitting: Vascular Surgery

## 2014-12-09 ENCOUNTER — Ambulatory Visit (INDEPENDENT_AMBULATORY_CARE_PROVIDER_SITE_OTHER): Payer: Medicare Other | Admitting: Vascular Surgery

## 2014-12-09 ENCOUNTER — Encounter: Payer: Self-pay | Admitting: Vascular Surgery

## 2014-12-09 ENCOUNTER — Ambulatory Visit (HOSPITAL_COMMUNITY)
Admission: RE | Admit: 2014-12-09 | Discharge: 2014-12-09 | Disposition: A | Discharge status: Home / Self Care | Payer: Medicare Other | Source: Ambulatory Visit | Attending: Vascular Surgery | Admitting: Vascular Surgery

## 2014-12-09 VITALS — BP 107/59 | HR 66 | Resp 18 | Ht 65.0 in | Wt 126.0 lb

## 2014-12-09 DIAGNOSIS — I714 Abdominal aortic aneurysm, without rupture, unspecified: Secondary | ICD-10-CM

## 2014-12-09 NOTE — Progress Notes (Signed)
Expand All Collapse All   She is a 81 year old female who returns for followup today for abdominal aortic aneurysm. She has had a known aneurysm that we have followed for several years. Recent ultrasound on July 27 showed a 5.6 cm aneurysm. 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 She has some mild dementia. She currently is living in a skilled nursing facility. She denies food fear. She denies weight loss. She denies any abdominal or back pain. She appears frail.    Past Medical History   Diagnosis  Date   .  AAA (abdominal aortic aneurysm)     .  Heart murmur     .  Hyperlipidemia     .  Arthritis     .  Joint pain     .  Hypertension     .  Osteoporosis     .  Insomnia     .  Allergic rhinitis     .  Anemia     .  Atrial fibrillation     .  Cancer         breast-Left       Past Surgical History   Procedure  Laterality  Date   .  Mastectomy           Left breast   .  Abdominal hysterectomy       .  Fracture surgery           arm       Current Outpatient Prescriptions on File Prior to Visit   Medication  Sig  Dispense  Refill   .  albuterol (PROVENTIL) (2.5 MG/3ML) 0.083% nebulizer solution  Take 3 mLs (2.5 mg total) by nebulization every 2 (two) hours as needed for wheezing.   75 mL   12   .  amLODipine (NORVASC) 10 MG tablet  Take 1 tablet (10 mg total) by mouth daily.   30 tablet   2   .  aspirin EC 81 MG tablet  Take 81 mg by mouth daily.           .  carvedilol (COREG) 25 MG tablet  Take 25 mg by mouth 2 (two) times daily with a meal.         .  feeding supplement, ENSURE COMPLETE, (ENSURE COMPLETE) LIQD  Take 237 mLs by mouth 2 (two) times daily between meals.   1 Bottle   10   .  fish oil-omega-3 fatty acids 1000 MG capsule  Take 2 g by mouth daily.           .  Garlic 951 MG CAPS  Take 600 mg by mouth 2 (two) times daily.         .  rosuvastatin (CRESTOR) 40 MG tablet  Take 40 mg by mouth daily.         .  traZODone (DESYREL)  100 MG tablet  Take 100 mg by mouth at bedtime as needed for sleep.             No current facility-administered medications on file prior to visit.     No Known Allergies  Review of systems: She is overall fairly inactive. She denies shortness of breath. However she does become short of breath with minimal activity.  Physical exam:   Filed Vitals:   12/09/14 1206  BP: 107/59  Pulse: 66  Resp: 18  Height: 5\' 5"  (1.651 m)  Weight: 126  lb (57.153 kg)    Neck: 2+ carotid pulses  Chest: Clear to auscultation bilaterally  Cardiac: Regular rate and rhythm  Abdomen: Soft nontender nondistended palpable pulsatile mass in epigastrium Extremities, 2+ femoral pulses bilaterally  Data: AAA 5.7 cm  Assessment: 5.7 cm abdominal aortic aneurysm in a frail elderly female. Had lengthy discussion with the patient and her son today. She is not a candidate for open operation. She also would not be a candidate for operation if the aneurysm ruptured. Overall the risk of rupture is low probably on the order of 5% per year. At this point the patient states she is no longer interested in aneurysm repair. She states that she is 81 years old and has left a full life and does not wish to have any further operations. I do not believe it is warranted to continue to follow this aneurysm if we are not going to consider repair. If the patient presents with a ruptured aneurysm I discussed with family today that we would not repair the aneurysm at that point. We have no plans to repair the aneurysm at any point in the future. She will follow-up on as-needed basis.  Ruta Hinds, MD [facility] [place] Office: [phone] Pager: [phone]

## 2015-04-11 ENCOUNTER — Other Ambulatory Visit: Payer: Self-pay

## 2015-07-30 IMAGING — CR DG CHEST 2V
3 series · 3 of 3 positions shown · non-contrast
Comparison: 05/27/2006 chest radiograph

CLINICAL DATA: Altered mental status

EXAM:
CHEST  2 VIEW

[w chest lat (1 of 2)]
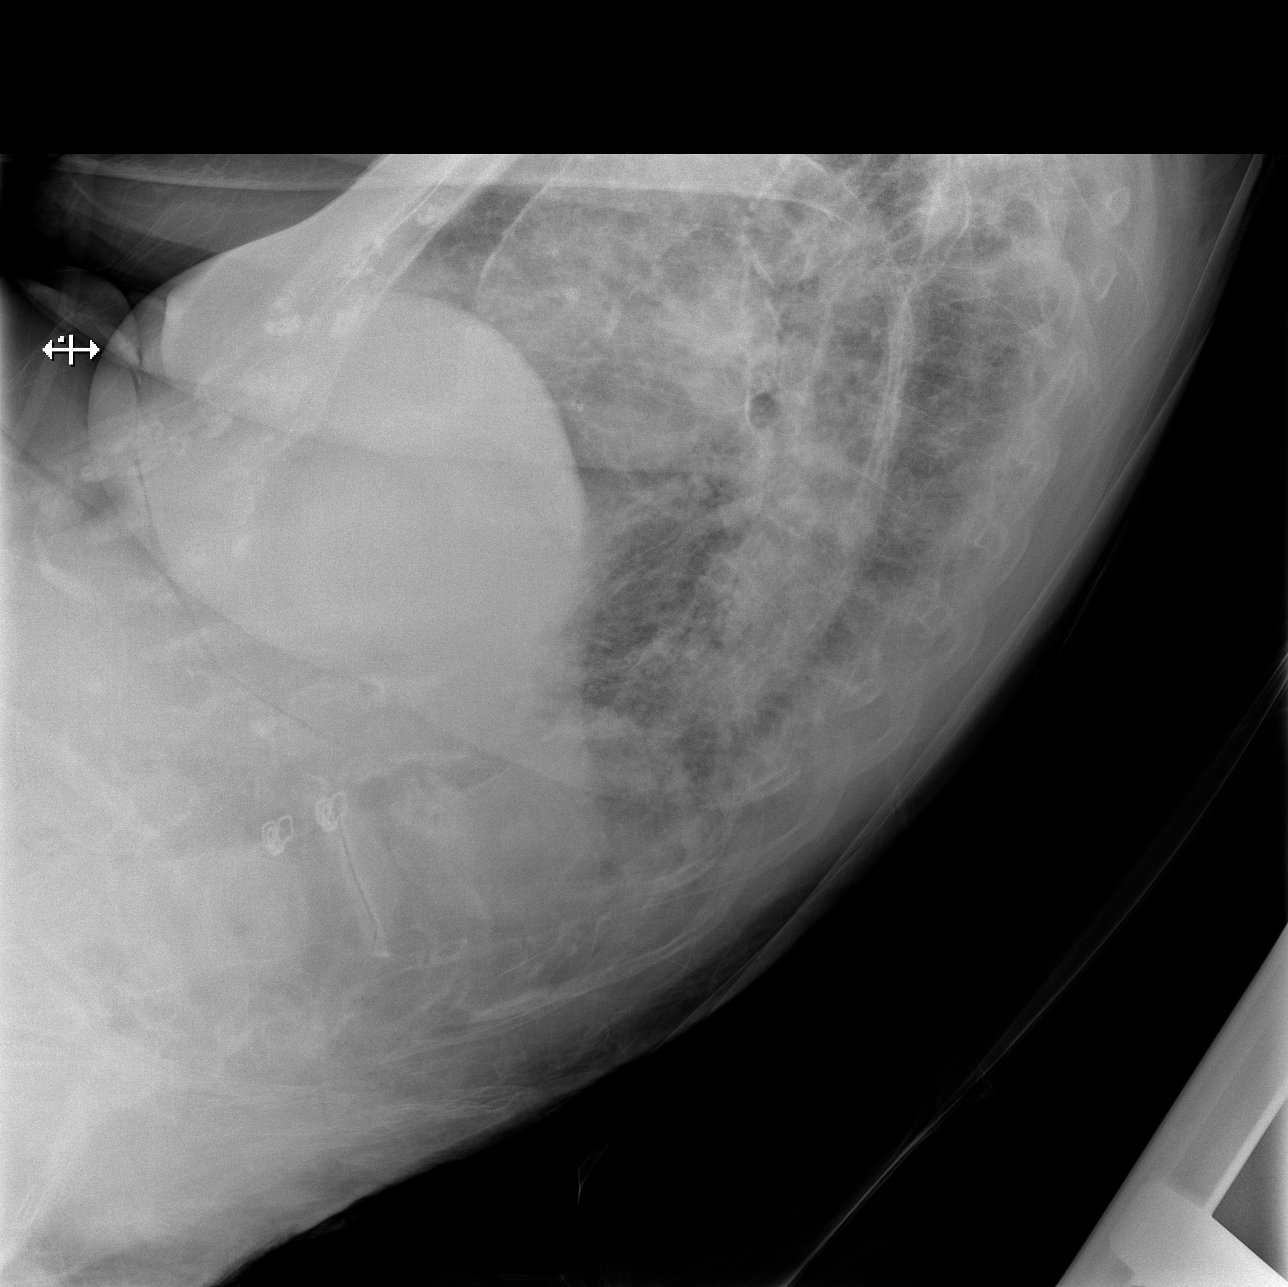

[w chest lat (2 of 2)]
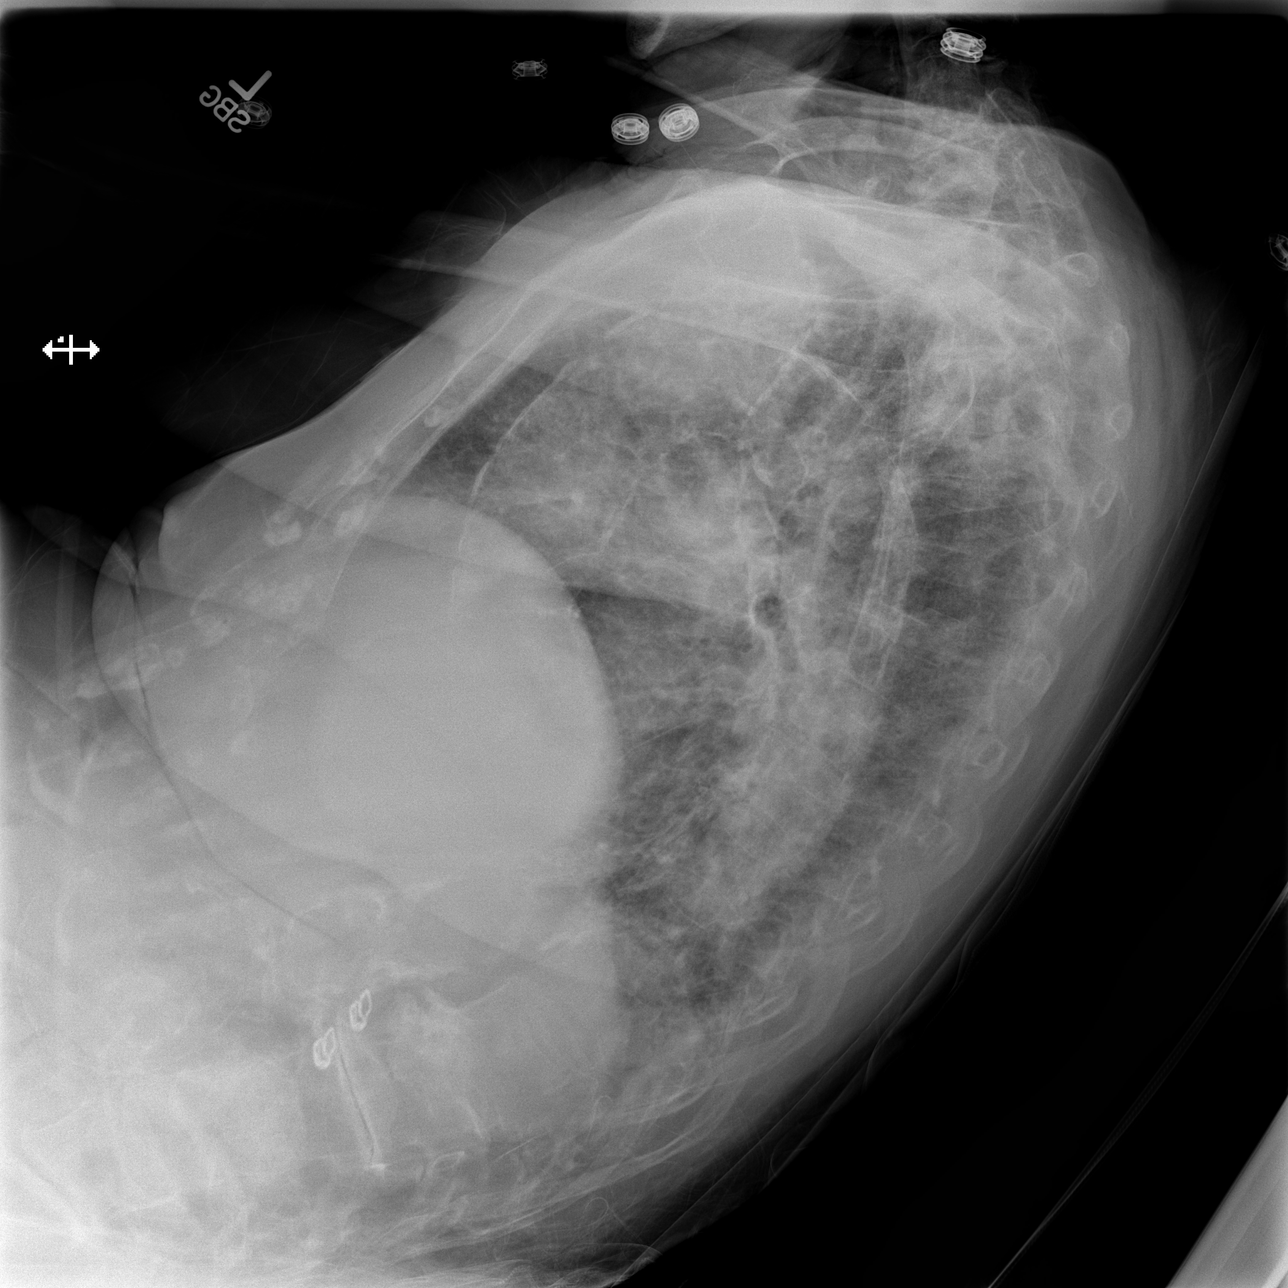

[x chest ap]
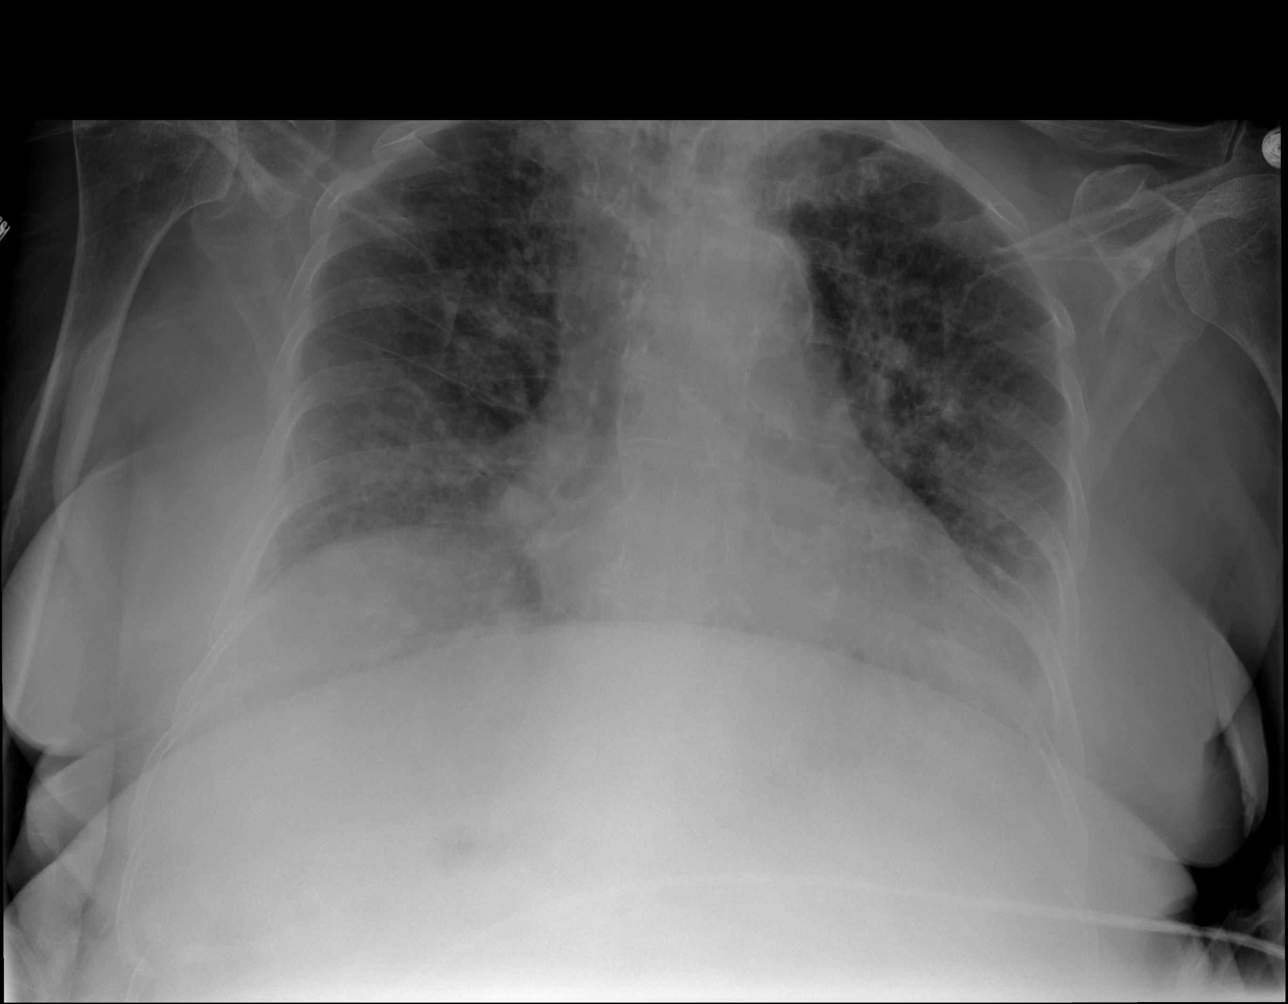

[3 of 3 positions shown; findings below may reference images not displayed]

FINDINGS: Upper limits normal heart size again noted.

New bilateral interstitial opacities may represent mild interstitial
edema versus infection.

There is no evidence of focal airspace disease, pulmonary edema,
suspicious pulmonary nodule/mass, pleural effusion, or pneumothorax.
No acute bony abnormalities are identified.
IMPRESSION: Bilateral interstitial opacities this may represent mild
interstitial edema versus interstitial infection.

Upper limits normal heart size.

## 2015-07-30 IMAGING — CT CT ABD-PELV W/O CM
2 of 3 series · 4 of 46 positions shown, 6 images · non-contrast
Comparison: 07/25/2011

CLINICAL DATA: Lethargy and increase confusion over 3 days. Altered
mental status. Hematuria.

EXAM:
CT ABDOMEN AND PELVIS WITHOUT CONTRAST
TECHNIQUE: Multidetector CT imaging of the abdomen and pelvis was performed
following the standard protocol without IV contrast.

[Series 206: coronals · coronal · 0.50mm/px · 3 of 88 slices shown, 4 images]
[im 20/88  soft-tissue]
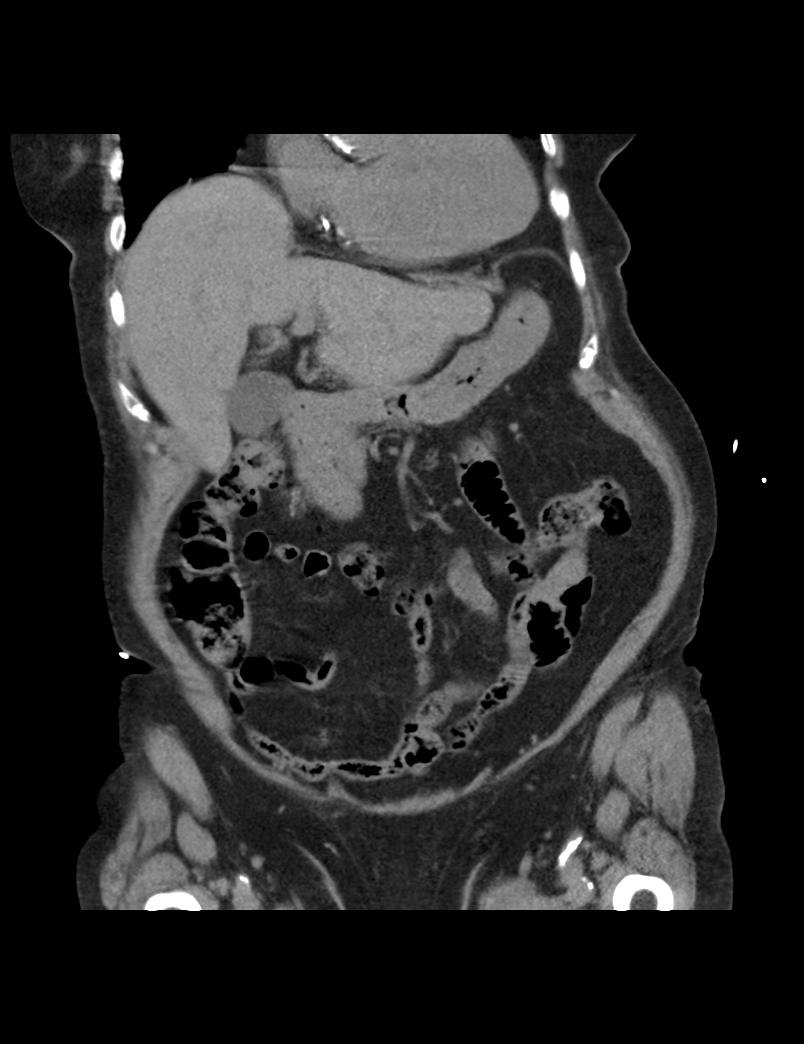
[im 20/88  bone]
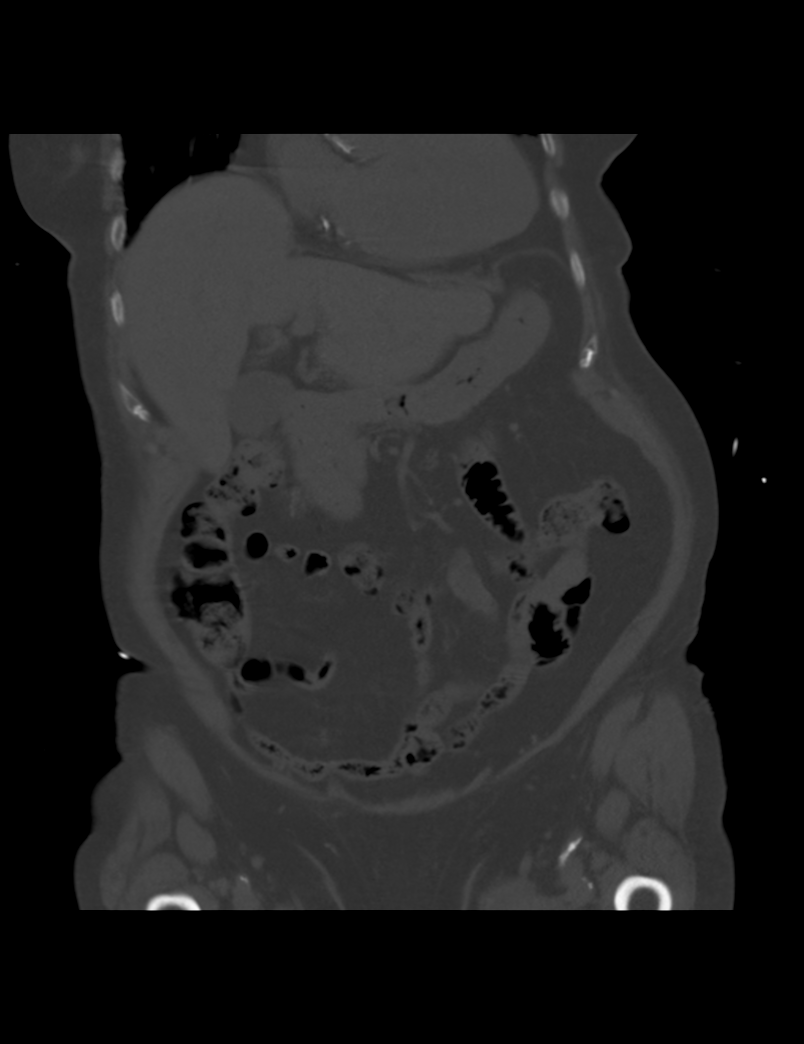
[im 49/88  soft-tissue]
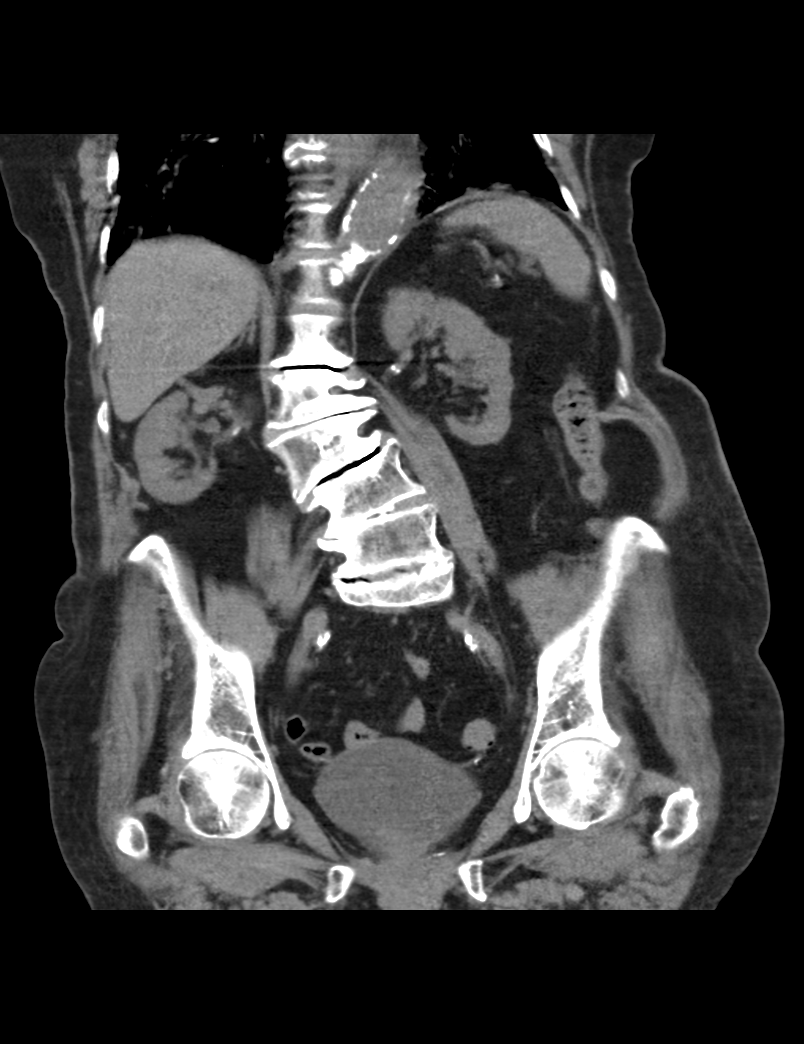
[im 68/88  soft-tissue]
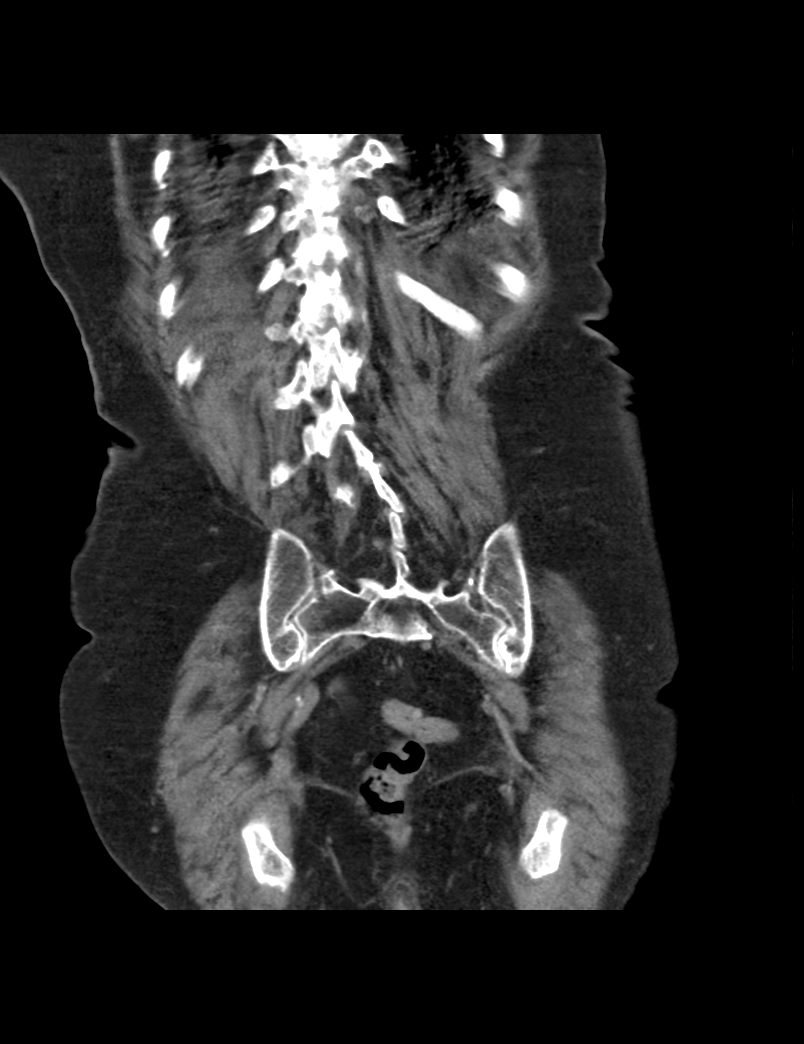

[Series 207: sagittals · sagittal · 0.50mm/px · 1 of 132 slices shown, 2 images]
[im 44/132  soft-tissue]
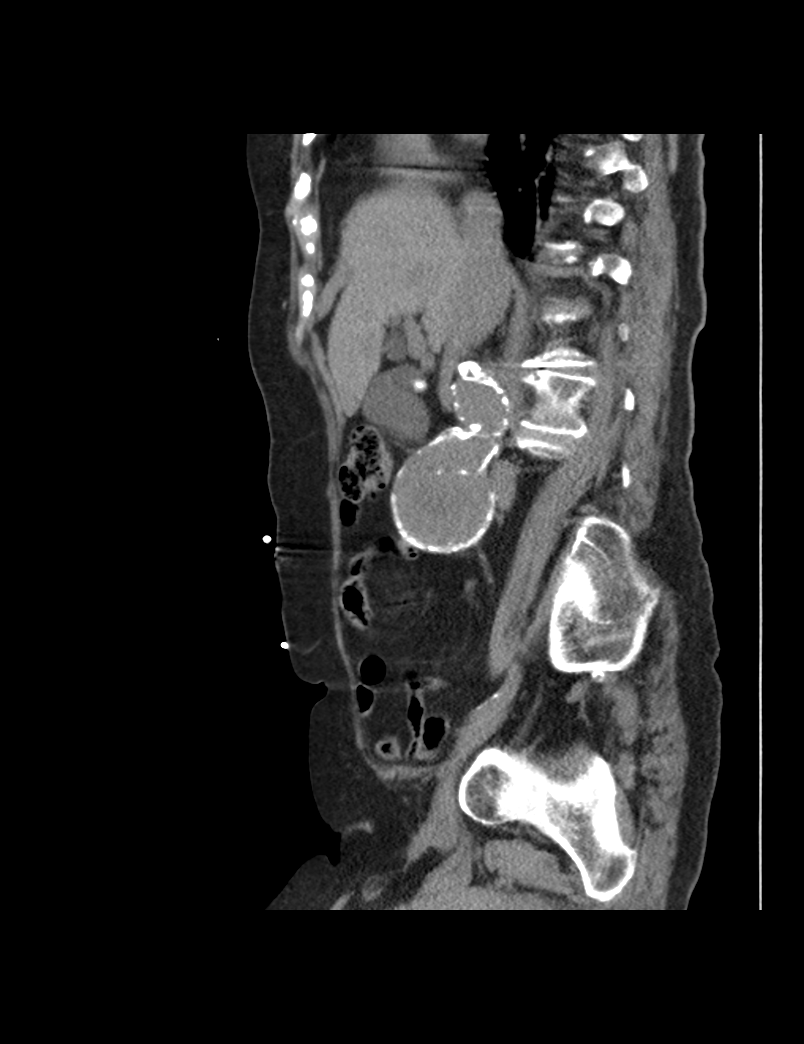
[im 44/132  bone]
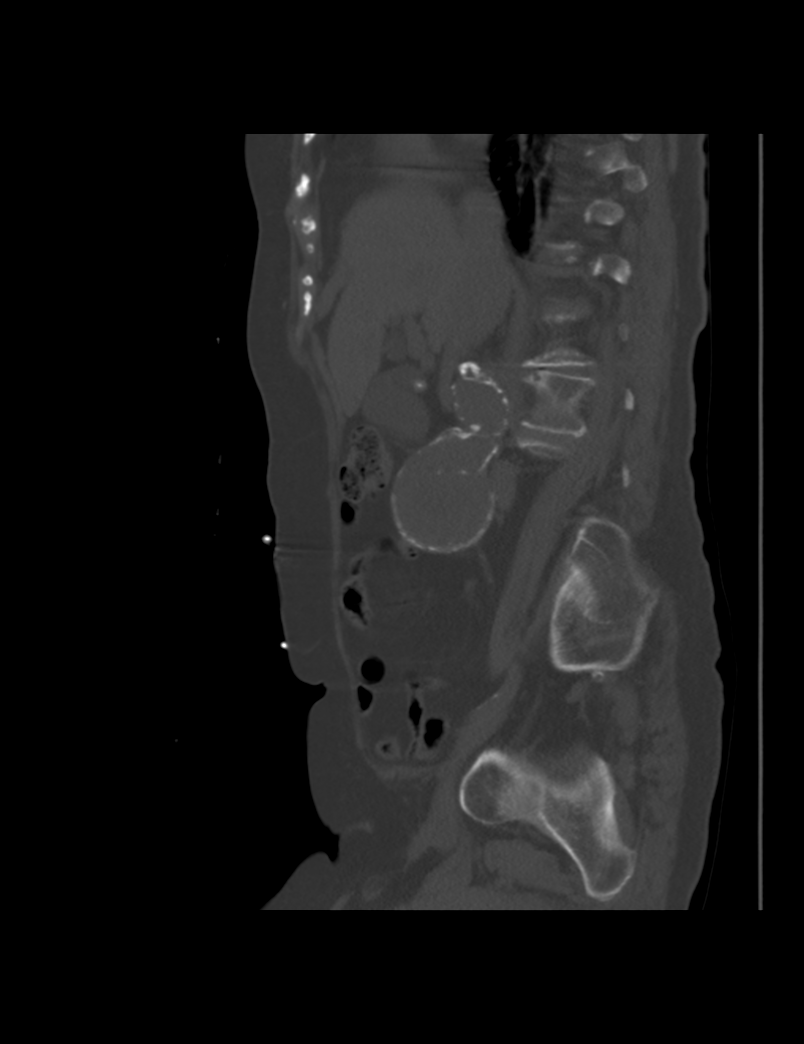

[4 of 46 positions shown; findings below may reference images not displayed]

FINDINGS: Lung bases demonstrated a minimal right pleural effusion. There is
motion artifact in the lung bases but there are changes consistent
with diffuse interstitial infiltration or edema with bronchial
thickening suggesting chronic bronchitis or airways disease.
Coronary artery calcifications.

The kidneys are symmetrical in size and shape. There is evidence of
bilateral parenchymal atrophy. No pyelocaliectasis or ureterectasis.
Central renal vascular calcifications are present. No renal,
ureteral, or bladder stones. No bladder wall thickening.

Cholelithiasis with multiple small stones in the gallbladder. No
inflammatory changes. There is a large infrarenal abdominal aortic
aneurysm measuring 5.4 cm maximal AP diameter. Extensive
calcification of the abdominal aorta and branch vessels. Aneurysm is
stable in size since prior study. No retroperitoneal hematoma.

The unenhanced appearance of the liver, spleen, pancreas, adrenal
glands, inferior vena cava, and retroperitoneal lymph nodes is
unremarkable. Stomach, small bowel, and colon are not abnormally
distended. No free air or free fluid in the abdomen.

Pelvis: Uterus appears to be surgically absent. No pelvic mass or
lymphadenopathy. Scattered diverticula in the sigmoid colon without
evidence of diverticulitis. Appendix is not identified. No free or
loculated pelvic fluid collections.

Lumbar scoliosis convex towards the right. Degenerative changes
throughout the lumbar spine and hips. No destructive bone lesions
appreciated.
IMPRESSION: No renal or ureteral stone or obstruction identified. Small right
pleural effusion with interstitial infiltration or edema an chronic
bronchitic changes/airways disease suggested in the lung bases.
Large abdominal aortic aneurysm measures 5.4 cm AP diameter, similar
to previous study. Cholelithiasis.

## 2015-08-05 DIAGNOSIS — Z86000 Personal history of in-situ neoplasm of breast: Secondary | ICD-10-CM | POA: Diagnosis not present

## 2015-08-05 DIAGNOSIS — M810 Age-related osteoporosis without current pathological fracture: Secondary | ICD-10-CM | POA: Diagnosis not present

## 2016-08-10 DIAGNOSIS — M810 Age-related osteoporosis without current pathological fracture: Secondary | ICD-10-CM | POA: Diagnosis not present

## 2016-08-10 DIAGNOSIS — Z853 Personal history of malignant neoplasm of breast: Secondary | ICD-10-CM | POA: Diagnosis not present

## 2018-02-12 DEATH — deceased
